# Patient Record
Sex: Female | Born: 1985 | Marital: Married | Smoking: Former smoker
Health system: Southern US, Community
[De-identification: ages and names within clinical notes are randomized; demographics above are authoritative.]

## PROBLEM LIST (undated history)

## (undated) DIAGNOSIS — F1910 Other psychoactive substance abuse, uncomplicated: Secondary | ICD-10-CM

## (undated) DIAGNOSIS — M419 Scoliosis, unspecified: Secondary | ICD-10-CM

## (undated) DIAGNOSIS — R569 Unspecified convulsions: Secondary | ICD-10-CM

## (undated) DIAGNOSIS — J45909 Unspecified asthma, uncomplicated: Secondary | ICD-10-CM

## (undated) DIAGNOSIS — I10 Essential (primary) hypertension: Secondary | ICD-10-CM

## (undated) DIAGNOSIS — F449 Dissociative and conversion disorder, unspecified: Secondary | ICD-10-CM

## (undated) HISTORY — PX: TUBAL LIGATION: SHX77

## (undated) HISTORY — PX: BACK SURGERY: SHX140

---

## 2011-04-03 ENCOUNTER — Emergency Department: Payer: Self-pay

## 2011-09-26 ENCOUNTER — Observation Stay: Payer: Self-pay

## 2013-03-23 ENCOUNTER — Emergency Department: Payer: Self-pay | Admitting: Emergency Medicine

## 2013-03-23 LAB — COMPREHENSIVE METABOLIC PANEL
Albumin: 3.8 g/dL (ref 3.4–5.0)
Alkaline Phosphatase: 44 U/L — ABNORMAL LOW (ref 50–136)
Bilirubin,Total: 0.3 mg/dL (ref 0.2–1.0)
Chloride: 110 mmol/L — ABNORMAL HIGH (ref 98–107)
EGFR (African American): >60
Glucose: 82 mg/dL (ref 65–99)
Osmolality: 281 (ref 275–301)
Potassium: 3.2 mmol/L — ABNORMAL LOW (ref 3.5–5.1)
Sodium: 142 mmol/L (ref 136–145)
Total Protein: 7.2 g/dL (ref 6.4–8.2)

## 2013-03-23 LAB — URINALYSIS, COMPLETE
Bilirubin,UR: NEGATIVE
Protein: 30
RBC,UR: 3 /HPF (ref 0–5)
Specific Gravity: 1.029 (ref 1.003–1.030)
WBC UR: 5 /HPF (ref 0–5)

## 2013-03-23 LAB — CBC
HCT: 31.4 % — ABNORMAL LOW (ref 35.0–47.0)
MCH: 31.6 pg (ref 26.0–34.0)
MCV: 94 fL (ref 80–100)
Platelet: 323 10*3/uL (ref 150–440)
RDW: 13.1 % (ref 11.5–14.5)
WBC: 3.1 10*3/uL — ABNORMAL LOW (ref 3.6–11.0)

## 2013-03-23 LAB — PREGNANCY, URINE: Pregnancy Test, Urine: NEGATIVE m[IU]/mL

## 2013-09-28 ENCOUNTER — Emergency Department: Payer: Self-pay | Admitting: Emergency Medicine

## 2013-10-31 ENCOUNTER — Emergency Department: Payer: Self-pay | Admitting: Emergency Medicine

## 2014-04-21 ENCOUNTER — Emergency Department: Payer: Self-pay | Admitting: Emergency Medicine

## 2014-04-21 LAB — BASIC METABOLIC PANEL
Anion Gap: 8 (ref 7–16)
BUN: 13 mg/dL (ref 7–18)
CHLORIDE: 107 mmol/L (ref 98–107)
CO2: 23 mmol/L (ref 21–32)
Calcium, Total: 9.5 mg/dL (ref 8.5–10.1)
Creatinine: 0.91 mg/dL (ref 0.60–1.30)
EGFR (African American): >60
EGFR (Non-African Amer.): >60
Glucose: 90 mg/dL (ref 65–99)
Osmolality: 275 (ref 275–301)
POTASSIUM: 3.9 mmol/L (ref 3.5–5.1)
SODIUM: 138 mmol/L (ref 136–145)

## 2014-04-21 LAB — CBC
HCT: 30.4 % — AB (ref 35.0–47.0)
HGB: 9.7 g/dL — AB (ref 12.0–16.0)
MCH: 27.9 pg (ref 26.0–34.0)
MCHC: 31.8 g/dL — ABNORMAL LOW (ref 32.0–36.0)
MCV: 88 fL (ref 80–100)
Platelet: 346 10*3/uL (ref 150–440)
RBC: 3.46 10*6/uL — ABNORMAL LOW (ref 3.80–5.20)
RDW: 19.3 % — ABNORMAL HIGH (ref 11.5–14.5)
WBC: 3.9 10*3/uL (ref 3.6–11.0)

## 2014-04-21 LAB — TROPONIN I

## 2014-04-21 LAB — LIPASE, BLOOD: Lipase: 106 U/L (ref 73–393)

## 2014-05-10 ENCOUNTER — Inpatient Hospital Stay: Payer: Self-pay | Admitting: Internal Medicine

## 2014-05-10 LAB — COMPREHENSIVE METABOLIC PANEL
ALBUMIN: 4.1 g/dL (ref 3.4–5.0)
ANION GAP: 8 (ref 7–16)
Alkaline Phosphatase: 47 U/L
BILIRUBIN TOTAL: 0.5 mg/dL (ref 0.2–1.0)
BUN: 9 mg/dL (ref 7–18)
CALCIUM: 8 mg/dL — AB (ref 8.5–10.1)
CHLORIDE: 102 mmol/L (ref 98–107)
CO2: 25 mmol/L (ref 21–32)
Creatinine: 0.79 mg/dL (ref 0.60–1.30)
EGFR (Non-African Amer.): >60
GLUCOSE: 116 mg/dL — AB (ref 65–99)
OSMOLALITY: 270 (ref 275–301)
POTASSIUM: 3.3 mmol/L — AB (ref 3.5–5.1)
SGOT(AST): 21 U/L (ref 15–37)
SGPT (ALT): 19 U/L
Sodium: 135 mmol/L — ABNORMAL LOW (ref 136–145)
TOTAL PROTEIN: 8 g/dL (ref 6.4–8.2)

## 2014-05-10 LAB — CBC
HCT: 33.3 % — AB (ref 35.0–47.0)
HGB: 10.3 g/dL — ABNORMAL LOW (ref 12.0–16.0)
MCH: 27.7 pg (ref 26.0–34.0)
MCHC: 31.1 g/dL — AB (ref 32.0–36.0)
MCV: 89 fL (ref 80–100)
Platelet: 325 10*3/uL (ref 150–440)
RBC: 3.73 10*6/uL — ABNORMAL LOW (ref 3.80–5.20)
RDW: 18.7 % — ABNORMAL HIGH (ref 11.5–14.5)
WBC: 8.3 10*3/uL (ref 3.6–11.0)

## 2014-05-10 LAB — URINALYSIS, COMPLETE
BACTERIA: NONE SEEN
BILIRUBIN, UR: NEGATIVE
BLOOD: NEGATIVE
Glucose,UR: 50 mg/dL (ref 0–75)
Ketone: NEGATIVE
Leukocyte Esterase: NEGATIVE
Nitrite: NEGATIVE
Ph: 6 (ref 4.5–8.0)
Protein: NEGATIVE
RBC,UR: <1 /HPF (ref 0–5)
SPECIFIC GRAVITY: 1.009 (ref 1.003–1.030)
Squamous Epithelial: 3
WBC UR: 2 /HPF (ref 0–5)

## 2014-05-10 LAB — TROPONIN I: Troponin-I: <0.02 ng/mL

## 2014-05-11 LAB — BASIC METABOLIC PANEL
ANION GAP: 4 — AB (ref 7–16)
BUN: 7 mg/dL (ref 7–18)
CALCIUM: 8.4 mg/dL — AB (ref 8.5–10.1)
CO2: 26 mmol/L (ref 21–32)
CREATININE: 0.75 mg/dL (ref 0.60–1.30)
Chloride: 105 mmol/L (ref 98–107)
EGFR (Non-African Amer.): >60
Glucose: 122 mg/dL — ABNORMAL HIGH (ref 65–99)
OSMOLALITY: 269 (ref 275–301)
POTASSIUM: 4.6 mmol/L (ref 3.5–5.1)
Sodium: 135 mmol/L — ABNORMAL LOW (ref 136–145)

## 2014-05-11 LAB — CBC WITH DIFFERENTIAL/PLATELET
BASOS ABS: 0 10*3/uL (ref 0.0–0.1)
Basophil %: 0.2 %
EOS ABS: 0 10*3/uL (ref 0.0–0.7)
Eosinophil %: 0 %
HCT: 31.3 % — ABNORMAL LOW (ref 35.0–47.0)
HGB: 9.6 g/dL — AB (ref 12.0–16.0)
LYMPHS ABS: 0.3 10*3/uL — AB (ref 1.0–3.6)
LYMPHS PCT: 2.4 %
MCH: 27.2 pg (ref 26.0–34.0)
MCHC: 30.6 g/dL — ABNORMAL LOW (ref 32.0–36.0)
MCV: 89 fL (ref 80–100)
MONOS PCT: 1.6 %
Monocyte #: 0.2 x10 3/mm (ref 0.2–0.9)
NEUTROS ABS: 10.9 10*3/uL — AB (ref 1.4–6.5)
NEUTROS PCT: 95.8 %
Platelet: 323 10*3/uL (ref 150–440)
RBC: 3.53 10*6/uL — AB (ref 3.80–5.20)
RDW: 18.4 % — ABNORMAL HIGH (ref 11.5–14.5)
WBC: 11.3 10*3/uL — ABNORMAL HIGH (ref 3.6–11.0)

## 2014-10-14 ENCOUNTER — Emergency Department: Payer: Self-pay | Admitting: Emergency Medicine

## 2014-10-14 LAB — BASIC METABOLIC PANEL
Anion Gap: 6 — ABNORMAL LOW (ref 7–16)
BUN: 10 mg/dL (ref 7–18)
CALCIUM: 8.7 mg/dL (ref 8.5–10.1)
CO2: 24 mmol/L (ref 21–32)
Chloride: 108 mmol/L — ABNORMAL HIGH (ref 98–107)
Creatinine: 0.89 mg/dL (ref 0.60–1.30)
EGFR (Non-African Amer.): >60
Glucose: 84 mg/dL (ref 65–99)
Osmolality: 274 (ref 275–301)
Potassium: 4 mmol/L (ref 3.5–5.1)
Sodium: 138 mmol/L (ref 136–145)

## 2014-10-14 LAB — CBC
HCT: 36.9 % (ref 35.0–47.0)
HGB: 11.4 g/dL — ABNORMAL LOW (ref 12.0–16.0)
MCH: 26.6 pg (ref 26.0–34.0)
MCHC: 30.8 g/dL — ABNORMAL LOW (ref 32.0–36.0)
MCV: 86 fL (ref 80–100)
Platelet: 232 10*3/uL (ref 150–440)
RBC: 4.28 10*6/uL (ref 3.80–5.20)
RDW: 19.3 % — AB (ref 11.5–14.5)
WBC: 5.7 10*3/uL (ref 3.6–11.0)

## 2014-10-14 LAB — TROPONIN I: Troponin-I: <0.02 ng/mL

## 2014-10-20 ENCOUNTER — Emergency Department: Payer: Self-pay | Admitting: Emergency Medicine

## 2014-10-20 LAB — COMPREHENSIVE METABOLIC PANEL
ALBUMIN: 4.6 g/dL (ref 3.4–5.0)
ALK PHOS: 56 U/L
Anion Gap: 11 (ref 7–16)
BUN: 10 mg/dL (ref 7–18)
Bilirubin,Total: 0.4 mg/dL (ref 0.2–1.0)
CALCIUM: 9.3 mg/dL (ref 8.5–10.1)
CREATININE: 0.94 mg/dL (ref 0.60–1.30)
Chloride: 103 mmol/L (ref 98–107)
Co2: 20 mmol/L — ABNORMAL LOW (ref 21–32)
EGFR (African American): >60
EGFR (Non-African Amer.): >60
GLUCOSE: 131 mg/dL — AB (ref 65–99)
Osmolality: 269 (ref 275–301)
Potassium: 3.8 mmol/L (ref 3.5–5.1)
SGOT(AST): 16 U/L (ref 15–37)
SGPT (ALT): 19 U/L
Sodium: 134 mmol/L — ABNORMAL LOW (ref 136–145)
Total Protein: 8.7 g/dL — ABNORMAL HIGH (ref 6.4–8.2)

## 2014-10-20 LAB — URINALYSIS, COMPLETE
BILIRUBIN, UR: NEGATIVE
Blood: NEGATIVE
Glucose,UR: NEGATIVE mg/dL (ref 0–75)
Nitrite: NEGATIVE
PH: 5 (ref 4.5–8.0)
Protein: 30
Specific Gravity: 1.02 (ref 1.003–1.030)

## 2014-10-20 LAB — CBC
HCT: 37.2 % (ref 35.0–47.0)
HGB: 11.2 g/dL — AB (ref 12.0–16.0)
MCH: 26.3 pg (ref 26.0–34.0)
MCHC: 30.1 g/dL — ABNORMAL LOW (ref 32.0–36.0)
MCV: 87 fL (ref 80–100)
Platelet: 487 10*3/uL — ABNORMAL HIGH (ref 150–440)
RBC: 4.25 10*6/uL (ref 3.80–5.20)
RDW: 20.1 % — ABNORMAL HIGH (ref 11.5–14.5)
WBC: 10.5 10*3/uL (ref 3.6–11.0)

## 2014-10-20 LAB — DRUG SCREEN, URINE
Amphetamines, Ur Screen: NEGATIVE (ref ?–1000)
BENZODIAZEPINE, UR SCRN: POSITIVE (ref ?–200)
Barbiturates, Ur Screen: NEGATIVE (ref ?–200)
Cannabinoid 50 Ng, Ur ~~LOC~~: NEGATIVE (ref ?–50)
Cocaine Metabolite,Ur ~~LOC~~: NEGATIVE (ref ?–300)
MDMA (ECSTASY) UR SCREEN: NEGATIVE (ref ?–500)
METHADONE, UR SCREEN: NEGATIVE (ref ?–300)
Opiate, Ur Screen: NEGATIVE (ref ?–300)
PHENCYCLIDINE (PCP) UR S: NEGATIVE (ref ?–25)
Tricyclic, Ur Screen: NEGATIVE (ref ?–1000)

## 2014-10-20 LAB — PREGNANCY, URINE: Pregnancy Test, Urine: NEGATIVE m[IU]/mL

## 2014-10-20 LAB — ETHANOL: Ethanol: <3 mg/dL

## 2014-10-20 LAB — TROPONIN I: Troponin-I: <0.02 ng/mL

## 2014-10-20 LAB — D-DIMER(ARMC): D-DIMER: 277 ng/mL

## 2014-10-20 LAB — CK: CK, Total: 126 U/L (ref 26–192)

## 2014-11-27 ENCOUNTER — Inpatient Hospital Stay: Payer: Self-pay | Admitting: Internal Medicine

## 2015-02-04 NOTE — Discharge Summary (Signed)
PATIENT NAME:  Crystal Villegas, Crystal Villegas MR#:  [id number] DATE OF BIRTH:  1986-06-15  DATE OF ADMISSION:  05/10/2014 DATE OF DISCHARGE:  05/12/2014  DISCHARGE DIAGNOSIS:  Acute hypoxic respiratory failure, likely secondary to acute asthma exacerbation.   SECONDARY DIAGNOSIS: Asthma.   CONSULTATIONS: None.   PROCEDURES AND RADIOLOGY: Chest x-ray on the July 28, showed no active cardiopulmonary disease.   MAJOR LABORATORY PANEL: Urinalysis on admission was negative.   HISTORY AND SHORT HOSPITAL COURSE: The patient is a 29 year old female with no significant medical problems who was admitted for acute hypoxic respiratory failure thought to be due to an asthma exacerbation. Please see Dr. Thomasena Edisadhika Kalisetti's dictated history and physical for further details. The patient was sick enough where she required CCU stepdown unit for close monitoring and BiPAP as needed. She was also having labile heart rate with fluctuation anywhere from 90s-130s, 140s and she got admitted, likely due to poor limb result. As the asthma started getting under better control, her heart rate also started improving. She was feeling much better, was placed on nasal cannula and was tolerating it fine, was close to her baseline when was discharged home on July 30, in stable condition.   PHYSICAL EXAMINATION: VITAL SIGNS: On the date of discharge are as follows: Temperature 98, heart rate 86 per minute, respirations 16 per minute, blood pressure 111/88 mmHg and she was saturating 97% on room air, although she desaturated to 84% on ambulation, and was provided 2 liters oxygen via nasal cannula.  CARDIOVASCULAR: S1, S2 normal. No murmurs, rubs, or gallop.  LUNGS: Clear to auscultation bilaterally. No wheezing, rales, rhonchi, or crepitation.  ABDOMEN: Soft, benign.  NEUROLOGIC: Nonfocal examination.   All other physical examination remained at baseline.   DISCHARGE MEDICATIONS: 1.   ProAir HFA 2 puffs inhaled, daily as needed.  2.  QVAR  2  puffs inhaled twice a day. 3.  Albuterol nebulizer every 6  hours as needed.  4.  Cardizem 30 mg p.o. b.i.d.  4.  Prednisone 60 mg p.o. daily, taper 5 mg daily until finished.   DISCHARGE DIET: Regular.   DISCHARGE ACTIVITY: As tolerated.   DISCHARGE INSTRUCTIONS AND FOLLOWUP:  The patient was instructed to follow up with Dr. Ned ClinesHerbon Fleming from pulmonary in 1-2 weeks. She was instructed to find a new primary care physician, likely Duncan Dulleresa Tullo or Ronna PolioJennifer Walker at [facility] in [place]. She actually got a new primary care physician, Dr. Verlon SettingSikes and she did get an appointment for August 18 at 10:00 a.m.  TOTAL TIME DISCHARGING THIS PATIENT: 45 minutes.   TIME DISCHARGING THIS PATIENT: 45 minutes.     ____________________________ Ellamae SiaVipul S. Sherryll BurgerShah, MD vss:ds D: 05/12/2014 21:56:03 ET T: 05/12/2014 22:09:32 ET JOB#: [id number]  cc: Elfida Shimada S. Sherryll BurgerShah, MD, <Dictator> Herbon E. Meredeth IdeFleming, MD Kris MoutonLarry J. Celine MansSykes, PA-C Ellamae SiaVIPUL S [facility] MD ELECTRONICALLY SIGNED 05/17/2014 9:29

## 2015-02-04 NOTE — H&P (Signed)
PATIENT NAME:  Crystal Villegas, Crystal Villegas MR#:  [id number] DATE OF BIRTH:  Apr 23, 1986  DATE OF ADMISSION:  05/10/2014  ADMITTING PHYSICIAN: Enid Baas, M.D.   PRIMARY CARE PHYSICIAN: Kris Mouton. Celine Mans, in [place].  CHIEF COMPLAINT: Difficulty breathing.   HISTORY OF PRESENT ILLNESS: Ms. Crystal Villegas is a 29 year old, African American female with past medical history significant for asthma, who presents from her PCP's office secondary to worsening breathing and asthma exacerbation. The patient states her breathing has gotten worse over the last couple of days. She has been wheezing pretty bad. She was using her inhalers and nebulizers at home, though with no improvement, so went to see her PCP today. She had significant wheezing, was hypoxic, saturations in the 80 degrees, so was placed on 5 liters nasal cannula and sent over to the ER. The patient has significant wheezing for which she was placed on continuous nebulizers. She received a dose of Solu-Medrol at the PCP's office already, and is being admitted for asthma exacerbation. The patient denies any recent travel or exposure to sick contacts or pets at this time. The last time she had exacerbation was several months ago and has not been hospitalized since she was young. Also, there is a history of intubation for asthma exacerbation in the remote past.   PAST MEDICAL HISTORY: Asthma.   PAST SURGICAL HISTORY:  1. C-sections, 3. 2. Back surgery for scoliosis.  ALLERGIES: No known drug allergies.   CURRENT HOME MEDICATIONS: 1. Qvar 40 mcg inhaler 2 puffs twice a day. 2. Albuterol inhaler 2 puffs q.6 h. p.r.n. for wheezing.  3. Albuterol nebulizer up to 4 times a day as needed for wheezing.   SOCIAL HISTORY: Lives at home with her husband. No smoking, alcohol or drug abuse. Works at the [employer].   FAMILY HISTORY: Both parents with heart disease.   REVIEW OF SYSTEMS:  CONSTITUTIONAL: No fever, fatigue or weakness.  EYES: No blurry vision,  double vision, inflammation or glaucoma.  ENT: No tinnitus, ear pain, hearing loss, epistaxis or discharge.  RESPIRATORY: Positive for wheezing and asthma. No COPD, hemoptysis or cough.  CARDIOVASCULAR: Positive for chest tightness from the difficulty breathing. No orthopnea, edema, arrhythmia, palpitations, or syncope.  GASTROINTESTINAL: No nausea, vomiting, diarrhea, abdominal pain, hematemesis, or melena.  GENITOURINARY: No dysuria, hematuria, renal calculi, frequency, or incontinence.  ENDOCRINE: No polyuria, nocturia, thyroid problems,  heat or cold intolerance.  HEMATOLOGY: No anemia, easy bruising or bleeding.  SKIN: No acne, rash or lesions. MUSCULOSKELETAL: No neck, back, or shoulder pain, arthritis or gout.  NEUROLOGIC: No numbness, weakness, CVA, TIA, or seizures.  PSYCHOLOGICAL: Denied anxiety, insomnia, and depression.   PHYSICAL EXAMINATION:  VITAL SIGNS: Temperature 98.8 degrees Fahrenheit, pulse 100, respirations 36, blood pressure 118/90, pulse oximetry 97% on 2 liters oxygen.  GENERAL: Well-built, well-nourished female sitting in bed, not in any acute distress.  HEENT: Normocephalic, atraumatic. Pupils equal, round, reacting to light. Anicteric sclerae. Extraocular movements intact. Oropharynx clear without erythema, mass or exudates.  NECK: Supple. No thyromegaly, JVD or carotid bruits. No lymphadenopathy.  LUNGS: She has significant expiratory wheezing diffusely throughout both lungs, use of accessory muscles noted on minimal exacerbation. No crackles. No rhonchi.  HEART: S1 and S2, rapid rate and regular rhythm. No murmurs, rubs, or gallops.  ABDOMEN: Soft, nontender, nondistended. No hepatosplenomegaly. Normal bowel sounds.  EXTREMITIES: No pedal edema. No clubbing or cyanosis; 2+ dorsalis pedis pulses palpable bilaterally.  SKIN: No acne, rash or lesions.  LYMPHATICS: No cervical or inguinal  lymphadenopathy.  NEUROLOGIC: Cranial nerves intact. No focal motor or sensory  deficits.  PSYCHOLOGICAL: The patient is awake, alert, oriented x 3.   LABORATORY DATA: WBC 8.3, hemoglobin 10.3, hematocrit 33.3, platelet count 325,000. Sodium 137, potassium 3.3, chloride 102, bicarbonate 25, BUN 9, creatinine 0.79, glucose 116, calcium of 8.0. ALT 19, AST 21, alkaline phosphatase 47, total bilirubin 0.5, albumin 4.1.   Chest x-ray showing clear lung fields. No acute cardiopulmonary disease.   Troponin negative.   EKG showing sinus tachycardia. No acute ST-T wave abnormalities.   ASSESSMENT AND PLAN: A 29 year old female with history of asthma sent in from PCP's office for acute asthma exacerbation and also hypoxia.   1. Hypoxic respiratory failure secondary to acute asthma exacerbation. Saturations were 80% on room air on arrival, now on oxygen support. Continue nebulizer treatments. Xopenex nebulizers ordered as she was tachycardic, and also continue IV Solu-Medrol.  2. Hypokalemia, is being replaced.  3. Deep venous thrombosis prophylaxis, on subcutaneous heparin.   CODE STATUS: Full Code.   TIME SPENT ON ADMISSION: 50 minutes.    ____________________________ Enid Baasadhika Cherrill Scrima, MD rk:jr D: 05/10/2014 13:38:38 ET T: 05/10/2014 14:38:33 ET JOB#: [id number]  cc: Enid Baasadhika Jourdon Zimmerle, MD, <Dictator> Kris MoutonLarry J. Gerilyn PilgrimSykes, PA-C Enid BaasADHIKA Lakhia Gengler MD ELECTRONICALLY SIGNED 05/11/2014 15:11

## 2015-02-12 NOTE — Discharge Summary (Signed)
PATIENT NAME:  Crystal Villegas, Crystal Villegas MR#:  [id number] DATE OF BIRTH:  1986-01-19  DATE OF ADMISSION:  11/27/2014 DATE OF DISCHARGE:  11/28/2014  ADMITTING DIAGNOSIS:  Shortness of breath.   DISCHARGE DIAGNOSES:   1.  Asthma exacerbation.  2.  History of seizure disorder.   CONSULTATIONS: None.   PROCEDURES: Chest x-ray, February 14, shows no abnormal cardiopulmonary findings.   HISTORY OF PRESENT ILLNESS: This 29 year old female with history of bronchial asthma and hypertension, presents to the Emergency Room with a complaint of ongoing and progressive shortness of breath with wheezing and cough for 2 days. She has been using nebulizers and inhalers at home but has continued to progress. On presentation to the Emergency Room oxygen saturation was 89% on room air. She received Solu-Medrol, magnesium and DuoNebs in the Emergency Room with minimal improvement. She is being admitted for further evaluation and treatment.   HOSPITAL COURSE AND TREATMENT:  1.  Asthma exacerbation. The patient received initial dose of 125 mg of methylprednisolone followed by 40 mg every 6 hours. She received 2 grams of magnesium in the Emergency Room. She was started on levofloxacin 500 mg daily. She did very well overnight and on the morning of discharge she was comfortable on room air with oxygen saturations 96% at rest and 94% with exertion. She does continue to have minimal wheezing. She reports multiple asthma exacerbations in the past and states that at this point, she feels comfortable managing this exacerbation at home. She is being discharged with nebulizers every 6 hours as needed for shortness of breath. Albuterol inhaler to be used as needed along with a prolonged prednisone taper and a prescription for Levaquin to complete a 7 day course. She will need to follow up with primary care in 1 to 2 weeks and also with pulmonology. She states she has had multiple hospitalizations for asthma in the past and it would be  appropriate for her to have more in depth evaluation by a pulmonologist. She was given contact information for [facility] and instructed to call for appointment as these offices were closed on the day of discharge.  2.  Hypertension: The patient's blood pressures were very well controlled in hospital, and she is being discharged on her former regimen of diltiazem 30 mg 1 tablet twice a day.  3.  History of seizure disorder: She continues on Keppra. She had no seizure-like activity during her hospitalization.    DISCHARGE PHYSICAL EXAMINATION:  VITAL SIGNS: Temperature 98.3, pulse 110, respirations 18, blood pressure 115/75, oxygenation 96% on room air at rest and 94 with exertion.  GENERAL: No acute distress.  RESPIRATORY: Scattered wheezes, good air movement. No respiratory distress.  CARDIOVASCULAR: Regular rate and rhythm. No murmurs, rubs or gallops. No peripheral edema. Peripheral pulses 2+.  ABDOMEN: Soft, nontender, nondistended. Bowel sounds are normal. No guarding, rebound, no hepatosplenomegaly.  EXTREMITIES: No joint effusions, strength 5 out of 5 throughout. Peripheral pulses 2+.  NEUROLOGIC: Cranial nerves II through XII are grossly intact. Strength and tone are normal and equal. Sensation is intact, nonfocal examination.  PSYCHIATRIC: The patient alert and oriented x4 with good insight into her clinical condition. No signs of uncontrolled depression and anxiety.   DISCHARGE LABORATORY:  Sodium 137, potassium 3.7, chloride 104, bicarbonate 26, BUN 8, creatinine 0.89, glucose 95 and LFTs normal. White blood cells 8, hemoglobin 11.7, hematocrit 37.5, platelets 331,000, MCV 89.  Sputum culture appears to be normal flora.   CONDITION ON DISCHARGE: Stable.   DISPOSITION: Discharge to  home with no further home health needs.   DISCHARGE MEDICATIONS:  1.  Albuterol 2.5 mg/3 mL one vial inhaled every 6 hours as needed for shortness of breath.  2.  Ventolin CFC free 90  mcg/inhalations 2 puffs inhaled 4 times a day as needed for shortness of breath.  3.  Diltiazem 30 mg 1 tablet twice a day.  4.  Levetiracetam 500 mg 1 tablet twice a day.  5.  Aleve 200 mg 1 tablet every 8 hours as needed for pain.  6.  Meclizine 25 mg 1 tablet once a day as needed for dizziness.  7.  Ferrous sulfate 325 mg 1 tablet twice a day.  8.  Prednisone 50 mg taper x 10 mg every 2 days and then stop.  9.  Levaquin 500 mg 1 tablet once a day for 7 days.   DISCHARGE INSTRUCTIONS:   DIET: No restrictions.   ACTIVITY: No restrictions.   FOLLOW-UP:  Follow up with primary care and also with [facility] in the next 1 to 2 weeks.   TIME SPENT ON DISCHARGE: 35 minutes.    ____________________________ Ena Dawleyatherine P. Clent RidgesWalsh, MD cpw:at D: 12/01/2014 17:44:20 ET T: 12/01/2014 18:33:31 ET JOB#: [id number]  cc: Santina Evansatherine P. Clent RidgesWalsh, MD, <Dictator> Gale JourneyATHERINE P Sumire Halbleib MD ELECTRONICALLY SIGNED 12/01/2014 23:30

## 2015-02-12 NOTE — H&P (Signed)
PATIENT NAME:  Crystal Villegas, Crystal Villegas MR#:  [id number] DATE OF BIRTH:  1986/03/22  DATE OF ADMISSION:  11/27/2014  REFERRING PHYSICIAN: Sheryl L. Mindi Junker, MD  PRIMARY CARE PRACTITIONER: Kris Mouton. Gerilyn Pilgrim, PA-C in [place].  ADMITTING DOCTOR: Crissie Figures, MD   CHIEF COMPLAINT: 1.  Shortness of breath with wheezing.  2.  Cough.   HISTORY OF PRESENT ILLNESS: A 29 year old female with a history of bronchial asthma, hypertension, presents to the Emergency Room with the complaints of ongoing shortness of breath with wheezing, wheezing associated with cough for the past 2 days. The patient has been using her nebulizers and inhalers at home but continued to have this cough with shortness of breath and wheezing, hence came to the Emergency Room for further evaluation. In the Emergency Room, on arrival, the patient was found to be in acute respiratory distress and room air oxygen saturations were 89%, was evaluated by the ED physician, and was found to have acute exacerbation of bronchial asthma, was given IV Solu-Medrol, IV magnesium, and vigorous DuoNebs along with oxygen supplementation, following which her oxygen saturations improved but continued to have some persistent wheezing. Hence, medical team hospitalist service was consulted for further evaluation and management. She does have some chest discomfort secondary due to coughing, but denies any chest pain. No dizziness or syncopal episodes. No fever. She does have some cough with minimal sputum. No nausea. No vomiting, diarrhea. No urinary symptoms. Following her treatment in the Emergency Room with IV Solu-Medrol, magnesium, and vigorous DuoNebs and oxygen supplementation, she states that she is feeling slightly better and comfortably resting.  PAST MEDICAL HISTORY:  1.  Bronchial asthma.  2.  Hypertension. 3. Seizure disorder.  PAST SURGICAL HISTORY:  1.  Back surgery.  2.  Status post C-sections x 3.   ALLERGIES: No known drug allergies.   FAMILY  HISTORY: Both parents with heart disease.   SOCIAL HISTORY: She is married, lives with her husband. She works at [employer] and no history of any smoking, alcohol, or substance abuse.  HOME MEDICATIONS:  1.  QVAR 1 puff twice a day. 2.  Albuterol inhaler 2 puffs every 4 to 6 hours as needed.  3. Keppra 500 mg bid. 4.Cardizem 30 mg bid.  REVIEW OF SYSTEMS:  CONSTITUTIONAL: Negative for fever or chills. She does have some fatigue.  EYES: Negative for blurred vision, double vision. No pain noted. No discharge.  EARS, NOSE, AND THROAT: Negative for tinnitus, ear pain, hearing loss, epistaxis, nasal discharge, or difficulty swallowing.  RESPIRATORY: Positive for cough with wheezing and dyspnea as noted in the history of present illness. No hemoptysis. She does have some chest soreness secondary due to coughing.  CARDIOVASCULAR: Negative for chest pain, palpitations, dizziness, syncope, orthopnea, PND, dyspnea on exertion, or pedal edema.  GASTROINTESTINAL: Negative for nausea, vomiting, diarrhea, constipation, abdominal pain, hematemesis, melena, rectal bleeding, GERD symptoms.  GENITOURINARY: Negative for dysuria, frequency, urgency, or hematuria.  ENDOCRINE: Negative for polyuria, nocturia, heat or cold intolerance.  HEMATOLOGIC AND LYMPHATIC: Negative for anemia, easy bruising, bleeding, or swollen glands.  INTEGUMENTARY: Negative for acne, skin rash, or lesions.  MUSCULOSKELETAL: Negative for back or neck pain. No history of arthritis or gout.  NEUROLOGICAL: Negative for focal weakness, numbness. No history of CVA, TIA.  PSYCHIATRIC: Negative for anxiety, insomnia, or depression.   PHYSICAL EXAMINATION:  VITAL SIGNS: On arrival, temperature 98.4 degrees Fahrenheit, pulse rate 116 per minute, respirations 20 per minute, blood pressure 115/86, oxygen saturation 89% on room air  on arrival, currently is 98% on oxygen supplementation.  GENERAL: Well-developed, well-nourished, alert, in no  acute distress, comfortably resting in the bed.  HEAD: Atraumatic, normocephalic.  EYES: Pupils equal, react to light and accommodation. No conjunctival pallor. No icterus. Extraocular movements intact.  NOSE: No drainage. No lesions.  EARS: No drainage, no external lesions. ORAL CAVITY: No mucosal lesions. NECK: Supple. No JVD. No thyromegaly. No carotid bruit. Range of motion of neck within normal limits.  RESPIRATORY: Bilateral air entry present. Not using accessory muscles of respiration. Bilateral rhonchi present. No rales. CARDIOVASCULAR: S1, S2 regular. No murmurs, gallops, or clicks or rubs. Peripheral pulses equal at carotid, femoral, and pedal pulses. No peripheral edema.  GASTROINTESTINAL: Abdomen soft, nontender. No hepatosplenomegaly. No masses noted. No rigidity, no guarding. Bowel sounds present and equal in all 4 quadrants.  GENITOURINARY: Deferred.  MUSCULOSKELETAL: No joint tenderness or effusion. Range of motion adequate. Strength and tone equal bilaterally.  SKIN: Inspection within normal limits. No obvious wounds.  LYMPHATIC: No cervical lymphadenopathy.  VASCULAR: Good dorsalis pedis and posterior tibial pulses. NEUROLOGICAL: Alert, awake, and oriented x 3. Cranial nerves II through XII grossly intact. No sensory defect. Motor strength 5/5 in both upper and lower extremities. DTRs 2+ and bilaterally symmetrical.  PSYCHIATRIC: Alert, awake, and oriented x 3. Judgment and insight adequate. Memory and mood within normal limits.   ANCILLARY DATA:   LABORATORY DATA: Serum glucose 95, BUN 8, creatinine 0.89, sodium 137, potassium 3.7, chloride 104, bicarbonate 26, total calcium 8.8, total protein 8.2, albumin 4.0, total bilirubin 0.4, alkaline phosphatase 71, AST 26, ALT 15. WBC 8.0, hemoglobin 11.7, hematocrit 37.8, platelet count 331,000.   IMAGING STUDIES:  CHEST X-RAY: No abnormal cardiopulmonary finding.   ASSESSMENT AND PLAN: A 29 year old female with a history of  bronchial asthma, hypertension, who presents with the complaints of shortness of breath with wheezing associated with cough ongoing for the past 2 days which was not relieved by her nebulizers at home, hence, came to the Emergency Room for further evaluation. On evaluation, noted to have acute respiratory distress with hypoxia secondary due to acute exacerbation of bronchial asthma. 1.  Acute hypoxic respiratory failure secondary bronchial asthma exacerbation. 2.  Bronchial asthma exacerbation.   PLAN: Admit to medical floor. Continue oxygen supplementation, IV Solu-Medrol, sputum culture and sensitivity. Continue vigorous DuoNebs, IV Levaquin. Follow up oxygen saturations.  3.  Hypertension: Controlled on home medications. Continue same.  4.  Deep vein thrombosis prophylaxis: Subcutaneous Lovenox.  5.  Gastrointestinal prophylaxis: Proton pump inhibitor.   CODE STATUS: Full code.   TIME SPENT: 50 minutes.   ____________________________ Crissie FiguresEdavally N. Avrohom Mckelvin, MD enr:ST D: 11/27/2014 21:42:21 ET T: 11/27/2014 22:19:41 ET JOB#: [id number]  cc: Crissie FiguresEdavally N. Reather Steller, MD, <Dictator> Kris MoutonLarry J. Gerilyn PilgrimSykes, PA-C Debbe MountsEDAVALLY N Zakyria Metzinger MD ELECTRONICALLY SIGNED 11/28/2014 12:48

## 2015-07-05 ENCOUNTER — Emergency Department
Admission: EM | Admit: 2015-07-05 | Discharge: 2015-07-05 | Disposition: A | Discharge status: Home / Self Care | Payer: Medicaid Other | Attending: Emergency Medicine | Admitting: Emergency Medicine

## 2015-07-05 ENCOUNTER — Emergency Department: Payer: Medicaid Other

## 2015-07-05 ENCOUNTER — Encounter: Payer: Self-pay | Admitting: Emergency Medicine

## 2015-07-05 DIAGNOSIS — J440 Chronic obstructive pulmonary disease with acute lower respiratory infection: Secondary | ICD-10-CM | POA: Diagnosis not present

## 2015-07-05 DIAGNOSIS — R0602 Shortness of breath: Secondary | ICD-10-CM | POA: Diagnosis present

## 2015-07-05 DIAGNOSIS — F410 Panic disorder [episodic paroxysmal anxiety] without agoraphobia: Secondary | ICD-10-CM

## 2015-07-05 HISTORY — DX: Unspecified convulsions: R56.9

## 2015-07-05 HISTORY — DX: Unspecified asthma, uncomplicated: J45.909

## 2015-07-05 HISTORY — DX: Essential (primary) hypertension: I10

## 2015-07-05 HISTORY — DX: Scoliosis, unspecified: M41.9

## 2015-07-05 MED ORDER — DIAZEPAM 5 MG PO TABS
10.0000 mg | ORAL_TABLET | Freq: Once | ORAL | Status: AC
  Administered 2015-07-05: 10 mg via ORAL
  Filled 2015-07-05: qty 2

## 2015-07-05 MED ORDER — IBUPROFEN 800 MG PO TABS
800.0000 mg | ORAL_TABLET | Freq: Once | ORAL | Status: AC
  Administered 2015-07-05: 800 mg via ORAL
  Filled 2015-07-05: qty 1

## 2015-07-05 MED ORDER — DIAZEPAM 5 MG PO TABS: 5.0000 mg | ORAL_TABLET | Freq: Three times a day (TID) | ORAL | Status: DC | PRN

## 2015-07-05 NOTE — Discharge Instructions (Signed)

## 2015-07-05 NOTE — ED Notes (Signed)
Pt to ED via EMS with c/o sob, panic attack that woke her up from her sleep. Pt states her face and hands are tingling and hurt. Upon initial assessment, pt was hyperventilating, slow deep breaths encouraged, pt tearful.

## 2015-07-05 NOTE — ED Provider Notes (Addendum)
[facility] Emergency Department Provider Note     Time seen: ----------------------------------------- 7:26 AM on 07/05/2015 -----------------------------------------    I have reviewed the triage vital signs and the nursing notes.   HISTORY  Chief Complaint Shortness of Breath and Panic Attack    HPI Crystal Villegas is a 29 y.o. female who presents ER for shortness of breath. Patient states panic attack woke her up from sleep. States her face and hands are tingling in her. She was hyperventilating stating that it hurt for her to breathe. Patient brought in tearful, states she has seen a pulmonologist in the past. She was told that her chest was inflamed but was not sure the exact diagnosis.   No past medical history on file.  There are no active problems to display for this patient.   No past surgical history on file.  Allergies Review of patient's allergies indicates not on file.  Social History Social History  Substance Use Topics  . Smoking status: Not on file  . Smokeless tobacco: Not on file  . Alcohol Use: Not on file    Review of Systems Constitutional: Negative for fever. Eyes: Negative for visual changes. ENT: Negative for sore throat. Cardiovascular: Positive for chest pain Respiratory: Positive for shortness of breath Gastrointestinal: Negative for abdominal pain, vomiting and diarrhea. Genitourinary: Negative for dysuria. Musculoskeletal: Negative for back pain. Skin: Negative for rash. Neurological: Negative for headaches, positive for tingling  10-point ROS otherwise negative.  ____________________________________________   PHYSICAL EXAM:  VITAL SIGNS: ED Triage Vitals  Enc Vitals Group     BP 07/05/15 0719 128/89 mmHg     Pulse Rate 07/05/15 0719 97     Resp 07/05/15 0719 26     Temp 07/05/15 0719 98 F (36.7 C)     Temp Source 07/05/15 0719 Oral     SpO2 --      Weight 07/05/15 0719 112 lb 7 oz (51.001 kg)      Height 07/05/15 0725  (1.499 m)     Head Cir --      Peak Flow --      Pain Score --      Pain Loc --      Pain Edu? --      Excl. in GC? --     Constitutional: Alert and oriented. Mild distress, very anxious and tearful Eyes: Conjunctivae are normal. PERRL. Normal extraocular movements. ENT   Head: Normocephalic and atraumatic.   Nose: No congestion/rhinnorhea.   Mouth/Throat: Mucous membranes are moist.   Neck: No stridor. Cardiovascular: Normal rate, regular rhythm. Normal and symmetric distal pulses are present in all extremities. No murmurs, rubs, or gallops. Respiratory: Normal respiratory effort without tachypnea nor retractions. Breath sounds are clear and equal bilaterally. No wheezes/rales/rhonchi. Gastrointestinal: Soft and nontender. No distention. No abdominal bruits.  Musculoskeletal: Nontender with normal range of motion in all extremities. No joint effusions.  No lower extremity tenderness nor edema. Neurologic:  Normal speech and language. No gross focal neurologic deficits are appreciated. Speech is normal. No gait instability. Skin:  Skin is warm, dry and intact. No rash noted. Psychiatric: Patient sobbing and crying in the room. This appears to be anxiety related. ____________________________________________  EKG: Interpreted by me. Sinus tachycardia with rate of 70 bpm, normal PR interval, normal gross with, normal QT T interval. Nonspecific T-wave changes.  ____________________________________________  ED COURSE:  Pertinent labs & imaging results that were available during my care of the patient were reviewed by  me and considered in my medical decision making (see chart for details). Patient appears to be having a panic attack. She received oral Valium and Motrin. ____________________________________________    RADIOLOGY Images were viewed by me  Chest x-ray is unremarkable  ____________________________________________  FINAL  ASSESSMENT AND PLAN  Panic attack  Plan: Patient with labs and imaging as dictated above. Patient will continue home with Valium to take as needed, she is currently stable for discharge. EKG is unchanged from prior.   Emily Filbert, MD   Emily Filbert, MD 07/05/15 1610  Emily Filbert, MD 07/05/15 [phone]

## 2016-01-03 ENCOUNTER — Other Ambulatory Visit: Payer: Self-pay | Admitting: Neurology

## 2016-01-03 DIAGNOSIS — R6889 Other general symptoms and signs: Principal | ICD-10-CM

## 2016-01-03 DIAGNOSIS — IMO0001 Reserved for inherently not codable concepts without codable children: Secondary | ICD-10-CM

## 2016-01-19 ENCOUNTER — Ambulatory Visit
Admission: RE | Admit: 2016-01-19 | Discharge: 2016-01-19 | Disposition: A | Discharge status: Home / Self Care | Payer: BLUE CROSS/BLUE SHIELD | Source: Ambulatory Visit | Attending: Neurology | Admitting: Neurology

## 2016-01-19 DIAGNOSIS — R6889 Other general symptoms and signs: Secondary | ICD-10-CM | POA: Diagnosis not present

## 2016-01-19 DIAGNOSIS — IMO0001 Reserved for inherently not codable concepts without codable children: Secondary | ICD-10-CM

## 2016-01-19 LAB — POCT I-STAT CREATININE: Creatinine, Ser: 0.9 mg/dL (ref 0.44–1.00)

## 2016-01-19 MED ORDER — GADOBENATE DIMEGLUMINE 529 MG/ML IV SOLN
10.0000 mL | Freq: Once | INTRAVENOUS | Status: AC | PRN
  Administered 2016-01-19: 10 mL via INTRAVENOUS

## 2016-04-23 ENCOUNTER — Emergency Department
Admission: EM | Admit: 2016-04-23 | Discharge: 2016-04-23 | Disposition: A | Discharge status: Home / Self Care | Payer: Medicaid Other | Attending: Emergency Medicine | Admitting: Emergency Medicine

## 2016-04-23 ENCOUNTER — Emergency Department: Payer: Medicaid Other

## 2016-04-23 ENCOUNTER — Encounter: Payer: Self-pay | Admitting: Emergency Medicine

## 2016-04-23 DIAGNOSIS — R6884 Jaw pain: Secondary | ICD-10-CM | POA: Diagnosis not present

## 2016-04-23 DIAGNOSIS — M25571 Pain in right ankle and joints of right foot: Secondary | ICD-10-CM

## 2016-04-23 DIAGNOSIS — J45909 Unspecified asthma, uncomplicated: Secondary | ICD-10-CM | POA: Insufficient documentation

## 2016-04-23 DIAGNOSIS — S60512A Abrasion of left hand, initial encounter: Secondary | ICD-10-CM | POA: Diagnosis not present

## 2016-04-23 DIAGNOSIS — M25561 Pain in right knee: Secondary | ICD-10-CM | POA: Insufficient documentation

## 2016-04-23 DIAGNOSIS — Z7982 Long term (current) use of aspirin: Secondary | ICD-10-CM | POA: Diagnosis not present

## 2016-04-23 DIAGNOSIS — M419 Scoliosis, unspecified: Secondary | ICD-10-CM | POA: Insufficient documentation

## 2016-04-23 DIAGNOSIS — Y999 Unspecified external cause status: Secondary | ICD-10-CM | POA: Insufficient documentation

## 2016-04-23 DIAGNOSIS — Y92410 Unspecified street and highway as the place of occurrence of the external cause: Secondary | ICD-10-CM | POA: Insufficient documentation

## 2016-04-23 DIAGNOSIS — R0789 Other chest pain: Secondary | ICD-10-CM | POA: Diagnosis not present

## 2016-04-23 DIAGNOSIS — Y9389 Activity, other specified: Secondary | ICD-10-CM | POA: Diagnosis not present

## 2016-04-23 DIAGNOSIS — R55 Syncope and collapse: Secondary | ICD-10-CM | POA: Insufficient documentation

## 2016-04-23 DIAGNOSIS — S29012A Strain of muscle and tendon of back wall of thorax, initial encounter: Secondary | ICD-10-CM | POA: Insufficient documentation

## 2016-04-23 DIAGNOSIS — Z87891 Personal history of nicotine dependence: Secondary | ICD-10-CM | POA: Diagnosis not present

## 2016-04-23 DIAGNOSIS — Z79899 Other long term (current) drug therapy: Secondary | ICD-10-CM | POA: Diagnosis not present

## 2016-04-23 DIAGNOSIS — I10 Essential (primary) hypertension: Secondary | ICD-10-CM | POA: Diagnosis not present

## 2016-04-23 DIAGNOSIS — T07XXXA Unspecified multiple injuries, initial encounter: Secondary | ICD-10-CM

## 2016-04-23 DIAGNOSIS — S39012A Strain of muscle, fascia and tendon of lower back, initial encounter: Secondary | ICD-10-CM

## 2016-04-23 DIAGNOSIS — M79602 Pain in left arm: Secondary | ICD-10-CM

## 2016-04-23 DIAGNOSIS — M549 Dorsalgia, unspecified: Secondary | ICD-10-CM | POA: Diagnosis present

## 2016-04-23 DIAGNOSIS — S3991XA Unspecified injury of abdomen, initial encounter: Secondary | ICD-10-CM | POA: Insufficient documentation

## 2016-04-23 LAB — CBC
HCT: 34 % — ABNORMAL LOW (ref 35.0–47.0)
Hemoglobin: 11.4 g/dL — ABNORMAL LOW (ref 12.0–16.0)
MCH: 29.9 pg (ref 26.0–34.0)
MCHC: 33.7 g/dL (ref 32.0–36.0)
MCV: 88.9 fL (ref 80.0–100.0)
Platelets: 281 10*3/uL (ref 150–440)
RBC: 3.82 MIL/uL (ref 3.80–5.20)
RDW: 15 % — ABNORMAL HIGH (ref 11.5–14.5)
WBC: 4.4 10*3/uL (ref 3.6–11.0)

## 2016-04-23 LAB — BASIC METABOLIC PANEL
Anion gap: 7 (ref 5–15)
BUN: 8 mg/dL (ref 6–20)
CO2: 23 mmol/L (ref 22–32)
Calcium: 9.3 mg/dL (ref 8.9–10.3)
Chloride: 105 mmol/L (ref 101–111)
Creatinine, Ser: 0.91 mg/dL (ref 0.44–1.00)
GFR calc Af Amer: >60 mL/min (ref >60)
GFR calc non Af Amer: >60 mL/min (ref >60)
Glucose, Bld: 91 mg/dL (ref 65–99)
Potassium: 3.7 mmol/L (ref 3.5–5.1)
Sodium: 135 mmol/L (ref 135–145)

## 2016-04-23 LAB — HCG, QUANTITATIVE, PREGNANCY: hCG, Beta Chain, Quant, S: <1 m[IU]/mL (ref <5)

## 2016-04-23 MED ORDER — FENTANYL CITRATE (PF) 100 MCG/2ML IJ SOLN
50.0000 ug | Freq: Once | INTRAMUSCULAR | Status: AC
  Administered 2016-04-23: 50 ug via INTRAVENOUS
  Filled 2016-04-23: qty 2

## 2016-04-23 MED ORDER — IOPAMIDOL (ISOVUE-300) INJECTION 61%
100.0000 mL | Freq: Once | INTRAVENOUS | Status: AC | PRN
  Administered 2016-04-23: 100 mL via INTRAVENOUS
  Filled 2016-04-23: qty 100

## 2016-04-23 MED ORDER — IBUPROFEN 800 MG PO TABS: 800.0000 mg | ORAL_TABLET | Freq: Three times a day (TID) | ORAL | Status: DC | PRN

## 2016-04-23 MED ORDER — SODIUM CHLORIDE 0.9 % IV BOLUS (SEPSIS)
1000.0000 mL | Freq: Once | INTRAVENOUS | Status: AC
  Administered 2016-04-23: 1000 mL via INTRAVENOUS

## 2016-04-23 MED ORDER — OXYCODONE-ACETAMINOPHEN 5-325 MG PO TABS: 1.0000 | ORAL_TABLET | ORAL | Status: AC | PRN

## 2016-04-23 NOTE — Discharge Instructions (Signed)
Please take Motrin for mild to moderate pain and Percocet for severe pain.  Do not drive within 8 hours of taking Percocet.  You may use a heating pad or ice for 10 minutes every 2 hours to decrease pain in your back, left arm and right leg.  Return to the emergency department for severe pain, vomiting, numbness tingling or weakness, or for any other symptoms concerning to you.
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
## 2016-10-14 DIAGNOSIS — F449 Dissociative and conversion disorder, unspecified: Secondary | ICD-10-CM

## 2016-10-14 HISTORY — DX: Dissociative and conversion disorder, unspecified: F44.9

## 2017-06-12 ENCOUNTER — Emergency Department: Payer: Medicaid Other

## 2017-06-12 ENCOUNTER — Observation Stay
Admission: EM | Admit: 2017-06-12 | Discharge: 2017-06-13 | Disposition: A | Discharge status: Home / Self Care | Payer: Medicaid Other | Attending: Internal Medicine | Admitting: Internal Medicine

## 2017-06-12 ENCOUNTER — Encounter: Payer: Self-pay | Admitting: Emergency Medicine

## 2017-06-12 DIAGNOSIS — F1410 Cocaine abuse, uncomplicated: Secondary | ICD-10-CM | POA: Insufficient documentation

## 2017-06-12 DIAGNOSIS — I10 Essential (primary) hypertension: Secondary | ICD-10-CM | POA: Insufficient documentation

## 2017-06-12 DIAGNOSIS — Z79899 Other long term (current) drug therapy: Secondary | ICD-10-CM | POA: Insufficient documentation

## 2017-06-12 DIAGNOSIS — F17200 Nicotine dependence, unspecified, uncomplicated: Secondary | ICD-10-CM | POA: Insufficient documentation

## 2017-06-12 DIAGNOSIS — H5712 Ocular pain, left eye: Secondary | ICD-10-CM | POA: Diagnosis not present

## 2017-06-12 DIAGNOSIS — R569 Unspecified convulsions: Secondary | ICD-10-CM

## 2017-06-12 DIAGNOSIS — J0100 Acute maxillary sinusitis, unspecified: Secondary | ICD-10-CM | POA: Insufficient documentation

## 2017-06-12 DIAGNOSIS — R4182 Altered mental status, unspecified: Principal | ICD-10-CM | POA: Insufficient documentation

## 2017-06-12 DIAGNOSIS — M419 Scoliosis, unspecified: Secondary | ICD-10-CM | POA: Insufficient documentation

## 2017-06-12 DIAGNOSIS — R4781 Slurred speech: Secondary | ICD-10-CM | POA: Diagnosis not present

## 2017-06-12 DIAGNOSIS — G40909 Epilepsy, unspecified, not intractable, without status epilepticus: Secondary | ICD-10-CM | POA: Diagnosis not present

## 2017-06-12 DIAGNOSIS — Z7982 Long term (current) use of aspirin: Secondary | ICD-10-CM | POA: Insufficient documentation

## 2017-06-12 DIAGNOSIS — J45909 Unspecified asthma, uncomplicated: Secondary | ICD-10-CM | POA: Insufficient documentation

## 2017-06-12 DIAGNOSIS — F1910 Other psychoactive substance abuse, uncomplicated: Secondary | ICD-10-CM

## 2017-06-12 DIAGNOSIS — I959 Hypotension, unspecified: Secondary | ICD-10-CM | POA: Diagnosis not present

## 2017-06-12 DIAGNOSIS — Z9114 Patient's other noncompliance with medication regimen: Secondary | ICD-10-CM | POA: Diagnosis not present

## 2017-06-12 LAB — COMPREHENSIVE METABOLIC PANEL
ALBUMIN: 4.8 g/dL (ref 3.5–5.0)
ALT: 13 U/L — ABNORMAL LOW (ref 14–54)
ANION GAP: 11 (ref 5–15)
AST: 24 U/L (ref 15–41)
Alkaline Phosphatase: 47 U/L (ref 38–126)
BILIRUBIN TOTAL: 0.3 mg/dL (ref 0.3–1.2)
BUN: 11 mg/dL (ref 6–20)
CHLORIDE: 107 mmol/L (ref 101–111)
CO2: 20 mmol/L — AB (ref 22–32)
CREATININE: 0.83 mg/dL (ref 0.44–1.00)
Calcium: 9.4 mg/dL (ref 8.9–10.3)
GFR calc Af Amer: >60 mL/min (ref >60)
GLUCOSE: 107 mg/dL — AB (ref 65–99)
POTASSIUM: 3.6 mmol/L (ref 3.5–5.1)
Sodium: 138 mmol/L (ref 135–145)
Total Protein: 8.4 g/dL — ABNORMAL HIGH (ref 6.5–8.1)

## 2017-06-12 LAB — URINALYSIS, COMPLETE (UACMP) WITH MICROSCOPIC
Bilirubin Urine: NEGATIVE
GLUCOSE, UA: NEGATIVE mg/dL
KETONES UR: NEGATIVE mg/dL
Leukocytes, UA: NEGATIVE
Nitrite: NEGATIVE
PH: 6 (ref 5.0–8.0)
PROTEIN: 100 mg/dL — AB
Specific Gravity, Urine: 1.003 — ABNORMAL LOW (ref 1.005–1.030)

## 2017-06-12 LAB — URINE DRUG SCREEN, QUALITATIVE (ARMC ONLY)
Amphetamines, Ur Screen: NOT DETECTED
BENZODIAZEPINE, UR SCRN: NOT DETECTED
Barbiturates, Ur Screen: NOT DETECTED
CANNABINOID 50 NG, UR ~~LOC~~: NOT DETECTED
Cocaine Metabolite,Ur ~~LOC~~: POSITIVE — AB
MDMA (Ecstasy)Ur Screen: NOT DETECTED
Methadone Scn, Ur: NOT DETECTED
OPIATE, UR SCREEN: NOT DETECTED
PHENCYCLIDINE (PCP) UR S: NOT DETECTED
Tricyclic, Ur Screen: NOT DETECTED

## 2017-06-12 LAB — ETHANOL: Alcohol, Ethyl (B): 109 mg/dL — ABNORMAL HIGH (ref <5)

## 2017-06-12 LAB — CBC
HCT: 32 % — ABNORMAL LOW (ref 35.0–47.0)
Hemoglobin: 10 g/dL — ABNORMAL LOW (ref 12.0–16.0)
MCH: 25.3 pg — AB (ref 26.0–34.0)
MCHC: 31.2 g/dL — AB (ref 32.0–36.0)
MCV: 81.3 fL (ref 80.0–100.0)
PLATELETS: 421 10*3/uL (ref 150–440)
RBC: 3.94 MIL/uL (ref 3.80–5.20)
RDW: 18.3 % — AB (ref 11.5–14.5)
WBC: 6 10*3/uL (ref 3.6–11.0)

## 2017-06-12 LAB — POCT PREGNANCY, URINE: PREG TEST UR: NEGATIVE

## 2017-06-12 LAB — ACETAMINOPHEN LEVEL: Acetaminophen (Tylenol), Serum: <10 ug/mL — ABNORMAL LOW (ref 10–30)

## 2017-06-12 LAB — SALICYLATE LEVEL

## 2017-06-12 MED ORDER — DIAZEPAM 5 MG PO TABS
5.0000 mg | ORAL_TABLET | Freq: Three times a day (TID) | ORAL | Status: DC | PRN
  Administered 2017-06-12: 5 mg via ORAL
  Filled 2017-06-12: qty 1

## 2017-06-12 MED ORDER — MOMETASONE FURO-FORMOTEROL FUM 200-5 MCG/ACT IN AERO
2.0000 | INHALATION_SPRAY | Freq: Two times a day (BID) | RESPIRATORY_TRACT | Status: DC
Start: 2017-06-12 — End: 2017-06-13
  Administered 2017-06-12 – 2017-06-13 (×2): 2 via RESPIRATORY_TRACT
  Filled 2017-06-12: qty 8.8

## 2017-06-12 MED ORDER — SODIUM CHLORIDE 0.9 % IV BOLUS (SEPSIS)
1000.0000 mL | Freq: Once | INTRAVENOUS | Status: AC
Start: 2017-06-12 — End: 2017-06-12
  Administered 2017-06-12: 1000 mL via INTRAVENOUS

## 2017-06-12 MED ORDER — FAMOTIDINE 20 MG PO TABS
20.0000 mg | ORAL_TABLET | Freq: Every day | ORAL | Status: DC
  Administered 2017-06-12 – 2017-06-13 (×2): 20 mg via ORAL
  Filled 2017-06-12 (×2): qty 1

## 2017-06-12 MED ORDER — AMOXICILLIN-POT CLAVULANATE 875-125 MG PO TABS
1.0000 | ORAL_TABLET | Freq: Two times a day (BID) | ORAL | Status: DC
  Administered 2017-06-12 – 2017-06-13 (×2): 1 via ORAL
  Filled 2017-06-12 (×2): qty 1

## 2017-06-12 MED ORDER — ALBUTEROL SULFATE (2.5 MG/3ML) 0.083% IN NEBU
2.5000 mg | INHALATION_SOLUTION | RESPIRATORY_TRACT | Status: DC | PRN
Start: 2017-06-12 — End: 2017-06-13
  Administered 2017-06-12: 2.5 mg via RESPIRATORY_TRACT

## 2017-06-12 MED ORDER — IBUPROFEN 400 MG PO TABS: 600.0000 mg | ORAL_TABLET | Freq: Three times a day (TID) | ORAL | Status: DC | PRN

## 2017-06-12 MED ORDER — ASPIRIN EC 81 MG PO TBEC
81.0000 mg | DELAYED_RELEASE_TABLET | Freq: Every day | ORAL | Status: DC
  Administered 2017-06-12 – 2017-06-13 (×2): 81 mg via ORAL
  Filled 2017-06-12 (×2): qty 1

## 2017-06-12 MED ORDER — POLYETHYLENE GLYCOL 3350 17 G PO PACK: 17.0000 g | PACK | Freq: Every day | ORAL | Status: DC | PRN

## 2017-06-12 MED ORDER — ONDANSETRON HCL 4 MG PO TABS: 4.0000 mg | ORAL_TABLET | Freq: Four times a day (QID) | ORAL | Status: DC | PRN

## 2017-06-12 MED ORDER — ACETAMINOPHEN 650 MG RE SUPP: 650.0000 mg | Freq: Four times a day (QID) | RECTAL | Status: DC | PRN

## 2017-06-12 MED ORDER — ALBUTEROL SULFATE (2.5 MG/3ML) 0.083% IN NEBU
INHALATION_SOLUTION | RESPIRATORY_TRACT | Status: AC
  Filled 2017-06-12: qty 3

## 2017-06-12 MED ORDER — LORAZEPAM 2 MG/ML IJ SOLN: 1.0000 mg | INTRAMUSCULAR | Status: DC | PRN

## 2017-06-12 MED ORDER — PNEUMOCOCCAL VAC POLYVALENT 25 MCG/0.5ML IJ INJ
0.5000 mL | INJECTION | INTRAMUSCULAR | Status: DC
  Filled 2017-06-12: qty 0.5

## 2017-06-12 MED ORDER — ONDANSETRON HCL 4 MG/2ML IJ SOLN: 4.0000 mg | Freq: Four times a day (QID) | INTRAMUSCULAR | Status: DC | PRN

## 2017-06-12 MED ORDER — THIAMINE HCL 100 MG/ML IJ SOLN
Freq: Once | INTRAVENOUS | Status: AC
  Administered 2017-06-12: 17:00:00 via INTRAVENOUS
  Filled 2017-06-12: qty 1000

## 2017-06-12 MED ORDER — ADULT MULTIVITAMIN W/MINERALS CH
1.0000 | ORAL_TABLET | Freq: Every day | ORAL | Status: DC
  Administered 2017-06-12 – 2017-06-13 (×2): 1 via ORAL
  Filled 2017-06-12 (×2): qty 1

## 2017-06-12 MED ORDER — DOCUSATE SODIUM 100 MG PO CAPS
100.0000 mg | ORAL_CAPSULE | Freq: Two times a day (BID) | ORAL | Status: DC
  Administered 2017-06-12 – 2017-06-13 (×2): 100 mg via ORAL
  Filled 2017-06-12 (×2): qty 1

## 2017-06-12 MED ORDER — ACETAMINOPHEN 325 MG PO TABS
650.0000 mg | ORAL_TABLET | Freq: Four times a day (QID) | ORAL | Status: DC | PRN
  Administered 2017-06-12 – 2017-06-13 (×2): 650 mg via ORAL
  Filled 2017-06-12 (×2): qty 2

## 2017-06-12 MED ORDER — LORAZEPAM 2 MG/ML IJ SOLN
1.0000 mg | Freq: Four times a day (QID) | INTRAMUSCULAR | Status: DC | PRN
  Administered 2017-06-12: 1 mg via INTRAVENOUS
  Filled 2017-06-12: qty 1

## 2017-06-12 MED ORDER — LAMOTRIGINE 25 MG PO TABS
25.0000 mg | ORAL_TABLET | Freq: Once | ORAL | Status: AC
  Administered 2017-06-12: 25 mg via ORAL
  Filled 2017-06-12: qty 1

## 2017-06-12 MED ORDER — ENOXAPARIN SODIUM 40 MG/0.4ML ~~LOC~~ SOLN
40.0000 mg | SUBCUTANEOUS | Status: DC
  Administered 2017-06-12: 40 mg via SUBCUTANEOUS
  Filled 2017-06-12: qty 0.4

## 2017-06-12 MED ORDER — LORAZEPAM 1 MG PO TABS: 1.0000 mg | ORAL_TABLET | Freq: Four times a day (QID) | ORAL | Status: DC | PRN

## 2017-06-12 MED ORDER — NICOTINE 7 MG/24HR TD PT24
7.0000 mg | MEDICATED_PATCH | Freq: Every day | TRANSDERMAL | Status: DC
  Filled 2017-06-12 (×2): qty 1
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
## 2017-06-12 NOTE — ED Notes (Signed)
Pt EKG performed, given to MD and exported. Pt vitals taken. Pt changed into gown and covered with sheet. Yellow socks placed on pt and pt placed on bed alarm.
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
## 2017-06-12 NOTE — ED Notes (Signed)
Patient assisted to use restroom. Ambulatory with standby assist. Patient repositioned in bed and hooked to cardiac monitor.

## 2017-06-13 ENCOUNTER — Observation Stay: Payer: Medicaid Other

## 2017-06-13 DIAGNOSIS — R569 Unspecified convulsions: Secondary | ICD-10-CM

## 2017-06-13 MED ORDER — GADOBENATE DIMEGLUMINE 529 MG/ML IV SOLN
10.0000 mL | Freq: Once | INTRAVENOUS | Status: AC | PRN
  Administered 2017-06-13: 10 mL via INTRAVENOUS

## 2017-06-13 MED ORDER — LAMOTRIGINE 25 MG PO TABS
25.0000 mg | ORAL_TABLET | Freq: Two times a day (BID) | ORAL | Status: DC
  Administered 2017-06-13: 10:00:00 25 mg via ORAL
  Filled 2017-06-13: qty 1

## 2017-06-13 MED ORDER — AMOXICILLIN-POT CLAVULANATE 875-125 MG PO TABS: 1.0000 | ORAL_TABLET | Freq: Two times a day (BID) | ORAL | 0 refills | Status: DC

## 2017-06-13 MED ORDER — LAMOTRIGINE 25 MG PO TABS: 25.0000 mg | ORAL_TABLET | Freq: Two times a day (BID) | ORAL | 1 refills | Status: DC

## 2017-06-13 MED ORDER — LAMOTRIGINE 25 MG PO TABS: 25.0000 mg | ORAL_TABLET | Freq: Every day | ORAL | Status: DC

## 2017-06-13 NOTE — Discharge Summary (Addendum)
[facility] [place] [facility]   PATIENT NAME: Crystal Villegas    MR#:  [id number]  DATE OF BIRTH:  1986/02/11  DATE OF ADMISSION:  06/12/2017   ADMITTING PHYSICIAN: Ramonita Lab, MD  DATE OF DISCHARGE:  06/13/2017  PRIMARY CARE PHYSICIAN: Thornton Papas, PA-C   ADMISSION DIAGNOSIS:  Polysubstance abuse [F19.10] Post-ictal state (HCC) [R56.9] DISCHARGE DIAGNOSIS:  Active Problems:   Seizure (HCC)  SECONDARY DIAGNOSIS:   Past Medical History:  Diagnosis Date  . Asthma   . Hypertension   . Scoliosis   . Seizures [facility])    HOSPITAL COURSE:   Crystal Villegas  is a 31 y.o. female with a known history of  Seizures, stopped taking them her medications for 1 month, hypertension is presenting to the ED with a chief complaint of altered mental status. Patient is brought into the ED by her husband.  # Altered mental status possibly secondary to breakthrough seizures from noncompliance with the medications-has chronic history of seizures Lamictal 1 dose was given in the emergency department, resumed her home medication Lamictal. No seizure since admission. MRI brain is unremarkable and EEG is normal. Ativan as needed for breakthrough seizures CT head is negative The patient condition discharged to home today per Dr. Thad Ranger.  #Acute maxillary sinusitis Augmentin  #Hypotension with history of hypertension Provide IV fluids and monitor blood pressure closely Improved.  #Drug abuse Urine drug screen is positive for cocaine Patient will be benefited with outpatient drug rehabilitation center follow-up  #Tobacco abuse disorder Counseled patient to stop smoking, on nicotine patch.  GI prophylaxis with Pepcid Discussed with Dr. Thad Ranger. DISCHARGE CONDITIONS:  Stable, discharge to home today. CONSULTS OBTAINED:  Treatment Team:  Thana Farr, MD DRUG ALLERGIES:  No Known Allergies DISCHARGE MEDICATIONS:   Allergies as of 06/13/2017   No  Known Allergies     Medication List    STOP taking these medications   diltiazem 120 MG 24 hr capsule Commonly known as:  CARDIZEM CD     TAKE these medications   ADVAIR DISKUS 250-50 MCG/DOSE Aepb Generic drug:  Fluticasone-Salmeterol Inhale 1 puff into the lungs 2 (two) times daily.   albuterol (2.5 MG/3ML) 0.083% nebulizer solution Commonly known as:  PROVENTIL Inhale 3 mLs into the lungs every 6 (six) hours as needed.   amoxicillin-clavulanate 875-125 MG tablet Commonly known as:  AUGMENTIN Take 1 tablet by mouth every 12 (twelve) hours.   aspirin EC 81 MG tablet Take 81 mg by mouth daily.   diazepam 5 MG tablet Commonly known as:  VALIUM Take 1 tablet (5 mg total) by mouth every 8 (eight) hours as needed for muscle spasms.   docusate sodium 100 MG capsule Commonly known as:  COLACE Take 100 mg by mouth 2 (two) times daily.   ibuprofen 800 MG tablet Commonly known as:  ADVIL,MOTRIN Take 1 tablet (800 mg total) by mouth every 8 (eight) hours as needed.   lamoTRIgine 25 MG tablet Commonly known as:  LAMICTAL Take 1 tablet (25 mg total) by mouth 2 (two) times daily.   ONE-A-DAY WOMENS FORMULA Tabs Take 1 tablet by mouth daily.            Discharge Care Instructions        Start     Ordered   06/13/17 0000  Increase activity slowly     06/13/17 0905   06/13/17 0000  Diet - low sodium heart healthy     06/13/17 0905  06/13/17 0000  amoxicillin-clavulanate (AUGMENTIN) 875-125 MG tablet  Every 12 hours     06/13/17 0910   06/13/17 0000  lamoTRIgine (LAMICTAL) 25 MG tablet  2 times daily     06/13/17 0920       DISCHARGE INSTRUCTIONS:  See AVS.  If you experience worsening of your admission symptoms, develop shortness of breath, life threatening emergency, suicidal or homicidal thoughts you must seek medical attention immediately by calling 911 or calling your MD immediately  if symptoms less severe.  You Must read complete instructions/literature  along with all the possible adverse reactions/side effects for all the Medicines you take and that have been prescribed to you. Take any new Medicines after you have completely understood and accpet all the possible adverse reactions/side effects.   Please note  You were cared for by a hospitalist during your hospital stay. If you have any questions about your discharge medications or the care you received while you were in the hospital after you are discharged, you can call the unit and asked to speak with the hospitalist on call if the hospitalist that took care of you is not available. Once you are discharged, your primary care physician will handle any further medical issues. Please note that NO REFILLS for any discharge medications will be authorized once you are discharged, as it is imperative that you return to your primary care physician (or establish a relationship with a primary care physician if you do not have one) for your aftercare needs so that they can reassess your need for medications and monitor your lab values.    On the day of Discharge:  VITAL SIGNS:  Blood pressure 110/73, pulse (!) 58, temperature 98.2 F (36.8 C), temperature source Oral, resp. rate 16, height 5' (1.524 m), weight 108 lb 0.4 oz (49 kg), last menstrual period 06/11/2017, SpO2 100 %. PHYSICAL EXAMINATION:  GENERAL:  31 y.o.-year-old patient lying in the bed with no acute distress.  EYES: Pupils equal, round, reactive to light and accommodation. No scleral icterus. Extraocular muscles intact.  HEENT: Head atraumatic, normocephalic. Oropharynx and nasopharynx clear.  NECK:  Supple, no jugular venous distention. No thyroid enlargement, no tenderness.  LUNGS: Normal breath sounds bilaterally, no wheezing, rales,rhonchi or crepitation. No use of accessory muscles of respiration.  CARDIOVASCULAR: S1, S2 normal. No murmurs, rubs, or gallops.  ABDOMEN: Soft, non-tender, non-distended. Bowel sounds present. No  organomegaly or mass.  EXTREMITIES: No pedal edema, cyanosis, or clubbing.  NEUROLOGIC: Cranial nerves II through XII are intact. Muscle strength 5/5 in all extremities. Sensation intact. Gait not checked.  PSYCHIATRIC: The patient is alert and oriented x 3.  SKIN: No obvious rash, lesion, or ulcer.  DATA REVIEW:   CBC  Recent Labs Lab 06/12/17 0812  WBC 6.0  HGB 10.0*  HCT 32.0*  PLT 421    Chemistries   Recent Labs Lab 06/12/17 0812  NA 138  K 3.6  CL 107  CO2 20*  GLUCOSE 107*  BUN 11  CREATININE 0.83  CALCIUM 9.4  AST 24  ALT 13*  ALKPHOS 47  BILITOT 0.3     Microbiology Results  No results found for this or any previous visit.  RADIOLOGY:  Mr Laqueta JeanBrain W Wo Contrast  Result Date: 06/13/2017 CLINICAL DATA:  Initial evaluation for seizure, altered mental status. History of seizures. EXAM: MRI HEAD WITHOUT AND WITH CONTRAST TECHNIQUE: Multiplanar, multiecho pulse sequences of the brain and surrounding structures were obtained without and with intravenous contrast. CONTRAST:  10mL MULTIHANCE GADOBENATE DIMEGLUMINE 529 MG/ML IV SOLN COMPARISON:  Previous MRI from 01/19/2016. FINDINGS: Brain: Cerebral volume within normal limits for age. No focal parenchymal signal abnormality identified. No abnormal foci of restricted diffusion to suggest acute or subacute ischemia. Gray-white matter differentiation well maintained. No encephalomalacia to suggest chronic infarction. No foci of susceptibility artifact to suggest acute or chronic intracranial hemorrhage. No mass lesion, midline shift or mass effect. Ventricles normal size without evidence for hydrocephalus. No extra-axial fluid collection. Major dural sinuses grossly patent. No intrinsic temporal lobe abnormality. No abnormal enhancement. Pituitary gland suprasellar region within normal limits. Vascular: Major intracranial vascular flow voids are well maintained and are normal in appearance. Skull and upper cervical spine:  Craniocervical junction within normal limits. Visualized upper cervical spine unremarkable. Bone marrow signal intensity within normal limits. No scalp soft tissue abnormality. Sinuses/Orbits: Globes and orbital soft tissues within normal limits. Scattered mucosal thickening within the ethmoidal air cells and maxillary sinuses. No air-fluid level to suggest acute sinusitis. No mastoid effusion. Inner ear structures normal. Other: None. IMPRESSION: Stable normal MRI appearance of the brain. No acute intracranial process identified. Electronically Signed   By: Rise Mu M.D.   On: 06/13/2017 12:58     Management plans discussed with the patient, her husband and they are in agreement.  CODE STATUS: Full Code   TOTAL TIME TAKING CARE OF THIS PATIENT: 36 minutes.    Shaune Pollack M.D on 06/13/2017 at 2:31 PM  Between 7am to 6pm - Pager - [phone]  After 6pm go to [email or web address] - password [email or web address] [facility]  [facility] [place] [facility]  Office  [phone]  CC: Primary care physician; Thornton Papas, PA-C   Note: This dictation was prepared with Dragon dictation along with smaller phrase technology. Any transcriptional errors that result from this process are unintentional.

## 2017-06-13 NOTE — Progress Notes (Signed)
Pt being discharged home, discharge instructions and prescriptions reviewed with pt and husband, states understanding, pt with no complaints
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
## 2017-06-13 NOTE — Discharge Instructions (Signed)
Seizure precaution. Smoking and drug (cocain) cessation.

## 2017-06-14 LAB — HIV ANTIBODY (ROUTINE TESTING W REFLEX): HIV SCREEN 4TH GENERATION: NONREACTIVE

## 2017-06-15 LAB — LAMOTRIGINE LEVEL: Lamotrigine Lvl: NOT DETECTED ug/mL (ref 2.0–20.0)

## 2017-06-21 ENCOUNTER — Emergency Department
Admission: EM | Admit: 2017-06-21 | Discharge: 2017-06-21 | Discharge status: Against Medical Advice | Payer: Medicaid Other | Attending: Emergency Medicine | Admitting: Emergency Medicine

## 2017-06-21 ENCOUNTER — Encounter: Payer: Self-pay | Admitting: Emergency Medicine

## 2017-06-21 ENCOUNTER — Inpatient Hospital Stay
Admission: EM | Admit: 2017-06-21 | Discharge: 2017-06-24 | DRG: 093 | Disposition: A | Discharge status: Home / Self Care | Payer: Medicaid Other | Attending: Internal Medicine | Admitting: Internal Medicine

## 2017-06-21 DIAGNOSIS — R471 Dysarthria and anarthria: Principal | ICD-10-CM | POA: Diagnosis present

## 2017-06-21 DIAGNOSIS — M419 Scoliosis, unspecified: Secondary | ICD-10-CM | POA: Diagnosis present

## 2017-06-21 DIAGNOSIS — Z7982 Long term (current) use of aspirin: Secondary | ICD-10-CM

## 2017-06-21 DIAGNOSIS — Z79899 Other long term (current) drug therapy: Secondary | ICD-10-CM | POA: Diagnosis not present

## 2017-06-21 DIAGNOSIS — F17200 Nicotine dependence, unspecified, uncomplicated: Secondary | ICD-10-CM | POA: Insufficient documentation

## 2017-06-21 DIAGNOSIS — M79604 Pain in right leg: Secondary | ICD-10-CM | POA: Insufficient documentation

## 2017-06-21 DIAGNOSIS — I10 Essential (primary) hypertension: Secondary | ICD-10-CM | POA: Diagnosis present

## 2017-06-21 DIAGNOSIS — R4701 Aphasia: Secondary | ICD-10-CM | POA: Diagnosis present

## 2017-06-21 DIAGNOSIS — Z9119 Patient's noncompliance with other medical treatment and regimen: Secondary | ICD-10-CM | POA: Diagnosis not present

## 2017-06-21 DIAGNOSIS — F329 Major depressive disorder, single episode, unspecified: Secondary | ICD-10-CM | POA: Diagnosis present

## 2017-06-21 DIAGNOSIS — G40909 Epilepsy, unspecified, not intractable, without status epilepticus: Secondary | ICD-10-CM | POA: Diagnosis present

## 2017-06-21 DIAGNOSIS — J45909 Unspecified asthma, uncomplicated: Secondary | ICD-10-CM | POA: Diagnosis present

## 2017-06-21 DIAGNOSIS — Z7951 Long term (current) use of inhaled steroids: Secondary | ICD-10-CM

## 2017-06-21 DIAGNOSIS — R569 Unspecified convulsions: Secondary | ICD-10-CM

## 2017-06-21 DIAGNOSIS — N3001 Acute cystitis with hematuria: Secondary | ICD-10-CM | POA: Insufficient documentation

## 2017-06-21 DIAGNOSIS — R4781 Slurred speech: Secondary | ICD-10-CM

## 2017-06-21 DIAGNOSIS — M79605 Pain in left leg: Secondary | ICD-10-CM | POA: Insufficient documentation

## 2017-06-21 DIAGNOSIS — M6281 Muscle weakness (generalized): Secondary | ICD-10-CM | POA: Diagnosis present

## 2017-06-21 DIAGNOSIS — R29898 Other symptoms and signs involving the musculoskeletal system: Secondary | ICD-10-CM

## 2017-06-21 LAB — URINE DRUG SCREEN, QUALITATIVE (ARMC ONLY)
Amphetamines, Ur Screen: NOT DETECTED
BARBITURATES, UR SCREEN: NOT DETECTED
Benzodiazepine, Ur Scrn: POSITIVE — AB
COCAINE METABOLITE, UR ~~LOC~~: NOT DETECTED
Cannabinoid 50 Ng, Ur ~~LOC~~: NOT DETECTED
MDMA (ECSTASY) UR SCREEN: NOT DETECTED
Methadone Scn, Ur: NOT DETECTED
Opiate, Ur Screen: NOT DETECTED
Phencyclidine (PCP) Ur S: NOT DETECTED
Tricyclic, Ur Screen: NOT DETECTED

## 2017-06-21 LAB — CBC WITH DIFFERENTIAL/PLATELET
Basophils Absolute: 0 10*3/uL (ref 0–0.1)
Basophils Relative: 0 %
Eosinophils Absolute: 0.1 10*3/uL (ref 0–0.7)
Eosinophils Relative: 2 %
HEMATOCRIT: 32.9 % — AB (ref 35.0–47.0)
HEMOGLOBIN: 10.7 g/dL — AB (ref 12.0–16.0)
LYMPHS PCT: 37 %
Lymphs Abs: 1.5 10*3/uL (ref 1.0–3.6)
MCH: 26.6 pg (ref 26.0–34.0)
MCHC: 32.5 g/dL (ref 32.0–36.0)
MCV: 81.9 fL (ref 80.0–100.0)
MONO ABS: 0.5 10*3/uL (ref 0.2–0.9)
MONOS PCT: 12 %
NEUTROS ABS: 2 10*3/uL (ref 1.4–6.5)
NEUTROS PCT: 49 %
Platelets: 485 10*3/uL — ABNORMAL HIGH (ref 150–440)
RBC: 4.02 MIL/uL (ref 3.80–5.20)
RDW: 18.7 % — AB (ref 11.5–14.5)
WBC: 4.1 10*3/uL (ref 3.6–11.0)

## 2017-06-21 LAB — COMPREHENSIVE METABOLIC PANEL
ALK PHOS: 53 U/L (ref 38–126)
ALT: 19 U/L (ref 14–54)
AST: 22 U/L (ref 15–41)
Albumin: 4.7 g/dL (ref 3.5–5.0)
Anion gap: 6 (ref 5–15)
BILIRUBIN TOTAL: 0.4 mg/dL (ref 0.3–1.2)
BUN: 15 mg/dL (ref 6–20)
CO2: 27 mmol/L (ref 22–32)
CREATININE: 0.8 mg/dL (ref 0.44–1.00)
Calcium: 9.8 mg/dL (ref 8.9–10.3)
Chloride: 102 mmol/L (ref 101–111)
Glucose, Bld: 89 mg/dL (ref 65–99)
Potassium: 4.2 mmol/L (ref 3.5–5.1)
Sodium: 135 mmol/L (ref 135–145)
TOTAL PROTEIN: 8.7 g/dL — AB (ref 6.5–8.1)

## 2017-06-21 LAB — URINALYSIS, COMPLETE (UACMP) WITH MICROSCOPIC
Bilirubin Urine: NEGATIVE
GLUCOSE, UA: NEGATIVE mg/dL
HGB URINE DIPSTICK: NEGATIVE
KETONES UR: NEGATIVE mg/dL
NITRITE: NEGATIVE
PROTEIN: NEGATIVE mg/dL
Specific Gravity, Urine: 1.029 (ref 1.005–1.030)
pH: 5 (ref 5.0–8.0)

## 2017-06-21 LAB — TSH: TSH: 2.277 u[IU]/mL (ref 0.350–4.500)

## 2017-06-21 LAB — CK: CK TOTAL: 132 U/L (ref 38–234)

## 2017-06-21 LAB — T4, FREE: Free T4: 0.7 ng/dL (ref 0.61–1.12)

## 2017-06-21 LAB — PREGNANCY, URINE: PREG TEST UR: NEGATIVE

## 2017-06-21 LAB — SEDIMENTATION RATE: Sed Rate: 11 mm/hr (ref 0–20)

## 2017-06-21 MED ORDER — ONDANSETRON HCL 4 MG/2ML IJ SOLN: 4.0000 mg | Freq: Four times a day (QID) | INTRAMUSCULAR | Status: DC | PRN

## 2017-06-21 MED ORDER — NITROFURANTOIN MONOHYD MACRO 100 MG PO CAPS: 100.0000 mg | ORAL_CAPSULE | Freq: Two times a day (BID) | ORAL | 0 refills | Status: DC

## 2017-06-21 MED ORDER — MOMETASONE FURO-FORMOTEROL FUM 200-5 MCG/ACT IN AERO
2.0000 | INHALATION_SPRAY | Freq: Two times a day (BID) | RESPIRATORY_TRACT | Status: DC
  Administered 2017-06-22 – 2017-06-24 (×5): 2 via RESPIRATORY_TRACT
  Filled 2017-06-21: qty 8.8

## 2017-06-21 MED ORDER — LAMOTRIGINE 25 MG PO TABS
50.0000 mg | ORAL_TABLET | Freq: Every day | ORAL | Status: DC
  Administered 2017-06-21 – 2017-06-23 (×3): 50 mg via ORAL
  Filled 2017-06-21 (×3): qty 2

## 2017-06-21 MED ORDER — ONDANSETRON HCL 4 MG PO TABS: 4.0000 mg | ORAL_TABLET | Freq: Four times a day (QID) | ORAL | Status: DC | PRN

## 2017-06-21 MED ORDER — LAMOTRIGINE 25 MG PO TABS
25.0000 mg | ORAL_TABLET | Freq: Every morning | ORAL | Status: DC
  Administered 2017-06-22 – 2017-06-24 (×3): 25 mg via ORAL
  Filled 2017-06-21 (×3): qty 1

## 2017-06-21 MED ORDER — ASPIRIN EC 81 MG PO TBEC
81.0000 mg | DELAYED_RELEASE_TABLET | Freq: Every day | ORAL | Status: DC
  Administered 2017-06-21 – 2017-06-24 (×4): 81 mg via ORAL
  Filled 2017-06-21 (×4): qty 1

## 2017-06-21 MED ORDER — ACETAMINOPHEN 650 MG RE SUPP: 650.0000 mg | Freq: Four times a day (QID) | RECTAL | Status: DC | PRN

## 2017-06-21 MED ORDER — ACETAMINOPHEN 325 MG PO TABS
650.0000 mg | ORAL_TABLET | Freq: Four times a day (QID) | ORAL | Status: DC | PRN
  Administered 2017-06-21: 650 mg via ORAL
  Filled 2017-06-21: qty 2

## 2017-06-21 MED ORDER — ALBUTEROL SULFATE (2.5 MG/3ML) 0.083% IN NEBU
3.0000 mL | INHALATION_SOLUTION | Freq: Four times a day (QID) | RESPIRATORY_TRACT | Status: DC | PRN
  Filled 2017-06-21 (×3): qty 3

## 2017-06-21 MED ORDER — ENOXAPARIN SODIUM 40 MG/0.4ML ~~LOC~~ SOLN
40.0000 mg | SUBCUTANEOUS | Status: DC
  Filled 2017-06-21: qty 0.4

## 2017-06-21 NOTE — ED Provider Notes (Signed)
[facility] Emergency Department Provider Note  ____________________________________________  Time seen: Approximately 3:56 PM  I have reviewed the triage vital signs and the nursing notes.   HISTORY  Chief Complaint Abdominal Pain and Leg Pain   HPI Crystal Villegas is a 31 y.o. female with h/o seizure disorder and cocaine abusewho presents for evaluation of bilateral leg pain. Hasn't walked in the room to evaluate patient, patient refuses to speak with me and points at her husband. Her husband says the patient is here because she's been having leg pain. I asked the patient was she did not want to talk to me or answer my questions and she says "I don;t want to be here, I want to be transferred to [facility]". I asked patient to tell me about her pain and she said "Is there more than one more type of pain? Stop fucking asking me questions". I had previously seen that her UA was positive for UTI, so I asked her about urinary symptoms and she said "I have leg pain not urinary pain, are you deaf". At that point I asked if patient would like to received treatment and if so she would have to answer my questions and allow me to examine her otherwise I would be glad to discharge her if she would prefer to go to [facility] for treatment. Patient then asked me to discharge her because she didn't want to be here.  Past Medical History:  Diagnosis Date  . Asthma   . Hypertension   . Scoliosis   . Seizures [facility])     Patient Active Problem List   Diagnosis Date Noted  . Seizure (HCC) 06/12/2017    Past Surgical History:  Procedure Laterality Date  . BACK SURGERY    . TUBAL LIGATION      Prior to Admission medications   Medication Sig Start Date End Date Taking? Authorizing Provider  ADVAIR DISKUS 250-50 MCG/DOSE AEPB Inhale 1 puff into the lungs 2 (two) times daily.    [provider]  albuterol (PROVENTIL) (2.5 MG/3ML) 0.083% nebulizer solution Inhale 3 mLs into the  lungs every 6 (six) hours as needed.    [provider]  amoxicillin-clavulanate (AUGMENTIN) 875-125 MG tablet Take 1 tablet by mouth every 12 (twelve) hours. 06/13/17   Shaune Pollack, MD  aspirin EC 81 MG tablet Take 81 mg by mouth daily.    [provider]  diazepam (VALIUM) 5 MG tablet Take 1 tablet (5 mg total) by mouth every 8 (eight) hours as needed for muscle spasms. 07/05/15   Emily Filbert, MD  docusate sodium (COLACE) 100 MG capsule Take 100 mg by mouth 2 (two) times daily.    [provider]  ibuprofen (ADVIL,MOTRIN) 800 MG tablet Take 1 tablet (800 mg total) by mouth every 8 (eight) hours as needed. 04/23/16   Rockne Menghini, MD  lamoTRIgine (LAMICTAL) 25 MG tablet Take 1 tablet (25 mg total) by mouth 2 (two) times daily. 06/13/17   Shaune Pollack, MD  Multiple Vitamins-Calcium (ONE-A-DAY WOMENS FORMULA) TABS Take 1 tablet by mouth daily.    [provider]  nitrofurantoin, macrocrystal-monohydrate, (MACROBID) 100 MG capsule Take 1 capsule (100 mg total) by mouth 2 (two) times daily. 06/21/17 06/26/17  Nita Sickle, MD    Allergies Patient has no known allergies.  No family history on file.  Social History Social History  Substance Use Topics  . Smoking status: Current Some Day Smoker  . Smokeless tobacco: Current User  .  Alcohol use Yes    Review of Systems  Musculoskeletal: + b/l leg pain.  Unable to obtain due to patient's refusal to answer questions ____________________________________________   PHYSICAL EXAM:  VITAL SIGNS: ED Triage Vitals [06/21/17 1416]  Enc Vitals Group     BP 120/76     Pulse Rate 85     Resp 18     Temp 98.2 F (36.8 C)     Temp Source Oral     SpO2 100 %     Weight      Height      Head Circumference      Peak Flow      Pain Score      Pain Loc      Pain Edu?      Excl. in GC?    Unable to be performed as patient refused to allow an examination  General: No distress, ambulating with  no difficulty ____________________________________________   LABS (all labs ordered are listed, but only abnormal results are displayed)  Labs Reviewed  COMPREHENSIVE METABOLIC PANEL - Abnormal; Notable for the following:       Result Value   Total Protein 8.7 (*)    All other components within normal limits  CBC WITH DIFFERENTIAL/PLATELET - Abnormal; Notable for the following:    Hemoglobin 10.7 (*)    HCT 32.9 (*)    RDW 18.7 (*)    Platelets 485 (*)    All other components within normal limits  URINALYSIS, COMPLETE (UACMP) WITH MICROSCOPIC - Abnormal; Notable for the following:    Color, Urine YELLOW (*)    APPearance CLOUDY (*)    Leukocytes, UA MODERATE (*)    Bacteria, UA RARE (*)    Squamous Epithelial / LPF 6-30 (*)    All other components within normal limits  PREGNANCY, URINE   ____________________________________________  EKG  none  ____________________________________________  RADIOLOGY  none  ____________________________________________   PROCEDURES  Procedure(s) performed: None Procedures Critical Care performed:  None ____________________________________________   INITIAL IMPRESSION / ASSESSMENT AND PLAN / ED COURSE  31 y.o. female with h/o seizure disorder and cocaine abusewho presents for evaluation of bilateral leg pain. patient refused to answer any questions or allow me to examine her. Patient [facility]. I tried to talk to patient and her husband to understand why patient was so upset but she refused to speak with me. Patient using vulgar language and cursing at staff and myself. Patient was discharged AGAINST MEDICAL ADVICE. I reviewed the patient blood work which shows no acute findings other than a UTI. She was prescribed Macrobid.     Pertinent labs & imaging results that were available during my care of the patient were reviewed by me and considered in my medical decision making (see chart  for details).    ____________________________________________   FINAL CLINICAL IMPRESSION(S) / ED DIAGNOSES  Final diagnoses:  Pain in both lower extremities  Acute cystitis with hematuria      NEW MEDICATIONS STARTED DURING THIS VISIT:  New Prescriptions   NITROFURANTOIN, MACROCRYSTAL-MONOHYDRATE, (MACROBID) 100 MG CAPSULE    Take 1 capsule (100 mg total) by mouth 2 (two) times daily.     Note:  This document was prepared using Dragon voice recognition software and may include unintentional dictation errors.    Don PerkingVeronese, [place], MD 06/21/17 [phone]

## 2017-06-21 NOTE — Discharge Instructions (Signed)

## 2017-06-21 NOTE — ED Provider Notes (Signed)
[facility] Emergency Department Provider Note ____________________________________________   I have reviewed the triage vital signs and the triage nursing note.  HISTORY  Chief Complaint Aphasia and Cerebrovascular Accident   Historian Patient And her husband  HPI Crystal Villegas is a 31 y.o. female with a history of seizures since she was a child, states that she takes Lamictal for that although there is some question about compliance, presents to the ER after having left the ER without evaluation because she was refusing to speak with the prior physician and was apparently belligerent with staff, and then signed back in at the urging of her family apparently.  Patient is able to provide her own history and she is having slurred speech and some slow and difficulty in getting her words out. Patient states that she was admitted to the hospital recently for being found altered and was admitted with workup and was under the impression that she had suffered a stroke.  I was able to review the results from the MRI and update both the patient and the family that the MRI was indeed normal. Patient's EKG was also normal.  patient states that since she was in the hospital she was having trouble forming her words and having some slurred speech as well as leg weakness. She states that since being discharged home she's had persistent and worsening leg weakness to the point where she is now using a walker that was given to her by a family member to walk.  States that she feels like her face muscles are weak. Denies vision changes. Denies fever. Denies tick bite. Denies skin rash or arthritis.  Reports no history of underlying psychiatric illness, but states that she has been under significant stress due to grief from multiple deaths in the family over the past several months.  No reported recalled illnesses in terms of upper respiratory or GI in the past several months.    Past  Medical History:  Diagnosis Date  . Asthma   . Hypertension   . Scoliosis   . Seizures [facility](HCC)     Patient Active Problem List   Diagnosis Date Noted  . Seizure (HCC) 06/12/2017    Past Surgical History:  Procedure Laterality Date  . BACK SURGERY    . TUBAL LIGATION      Prior to Admission medications   Medication Sig Start Date End Date Taking? Authorizing Provider  amoxicillin-clavulanate (AUGMENTIN) 875-125 MG tablet Take 1 tablet by mouth every 12 (twelve) hours. 06/13/17  Yes Shaune Pollackhen, Qing, MD  aspirin EC 81 MG tablet Take 81 mg by mouth daily.   Yes [provider]  docusate sodium (COLACE) 100 MG capsule Take 100 mg by mouth 2 (two) times daily.   Yes [provider]  ibuprofen (ADVIL,MOTRIN) 800 MG tablet Take 1 tablet (800 mg total) by mouth every 8 (eight) hours as needed. 04/23/16  Yes Rockne MenghiniNorman, Anne-Caroline, MD  lamoTRIgine (LAMICTAL) 25 MG tablet Take 1 tablet (25 mg total) by mouth 2 (two) times daily. Patient taking differently: Take 25 mg by mouth every morning.  06/13/17  Yes Shaune Pollackhen, Qing, MD  lamoTRIgine (LAMICTAL) 25 MG tablet Take 50 mg by mouth at bedtime.   Yes [provider]  Multiple Vitamins-Calcium (ONE-A-DAY WOMENS FORMULA) TABS Take 1 tablet by mouth daily.   Yes [provider]  ADVAIR DISKUS 250-50 MCG/DOSE AEPB Inhale 1 puff into the lungs 2 (two) times daily.    [provider]  albuterol (PROVENTIL) (2.5  MG/3ML) 0.083% nebulizer solution Inhale 3 mLs into the lungs every 6 (six) hours as needed.    [provider]  diazepam (VALIUM) 5 MG tablet Take 1 tablet (5 mg total) by mouth every 8 (eight) hours as needed for muscle spasms. Patient not taking: Reported on 06/21/2017 07/05/15   Emily Filbert, MD  nitrofurantoin, macrocrystal-monohydrate, (MACROBID) 100 MG capsule Take 1 capsule (100 mg total) by mouth 2 (two) times daily. Patient not taking: Reported on 06/21/2017 06/21/17 06/26/17  Nita Sickle,  MD    No Known Allergies  No family history on file.  Social History Social History  Substance Use Topics  . Smoking status: Current Some Day Smoker  . Smokeless tobacco: Current User  . Alcohol use Yes    Review of Systems  Constitutional: Negative for fever. Eyes: Negative for visual changes. ENT: Negative for sore throat. Cardiovascular: Negative for chest pain. Respiratory: Negative for shortness of breath. Gastrointestinal: Negative for abdominal pain, vomiting and diarrhea. Genitourinary: Negative for dysuria. Musculoskeletal: Negative for back pain. Skin: Negative for rash. Neurological: Negative for headache.  ____________________________________________   PHYSICAL EXAM:  VITAL SIGNS: ED Triage Vitals  Enc Vitals Group     BP 06/21/17 1735 130/66     Pulse Rate 06/21/17 1735 (!) 106     Resp 06/21/17 1735 18     Temp 06/21/17 1735 98.4 F (36.9 C)     Temp src --      SpO2 06/21/17 1735 100 %     Weight --      Height --      Head Circumference --      Peak Flow --      Pain Score 06/21/17 1737 10     Pain Loc --      Pain Edu? --      Excl. in GC? --      Constitutional: Alert and cooperative. Well appearing and in no distress. HEENT   Head: Normocephalic and atraumatic.      Eyes: Conjunctivae are normal. Pupils equal and round.       Ears:         Nose: No congestion/rhinnorhea.   Mouth/Throat: Mucous membranes are moist.  no pharyngeal erythema.   Neck: No stridor. Cardiovascular/Chest: Normal rate, regular rhythm.  No murmurs, rubs, or gallops. Respiratory: Normal respiratory effort without tachypnea nor retractions. Breath sounds are clear and equal bilaterally. No wheezes/rales/rhonchi. Gastrointestinal: Soft. No distention, no guarding, no rebound. Nontender. Genitourinary/rectal:Deferred Musculoskeletal: Nontender with normal range of motion in all extremities. No joint effusions.  No lower extremity tenderness.  No  edema. Neurologic:  no facial droop. Patient has somewhat slowed speech and somewhat slurred speech as she is not opening her mouth very wide. She is able to open her mouth wide for oral exam but seems to be fairly slow to open her mouth.  4 out of 5 strength in bilateral upper and lower extremities all the lower extremities seem to be more weak. When she stands well decided that she standing on her toes. She is able to walk with a walker on her own. Reports possible paresthesia to the right face, but otherwise no sensory changes.  unable to elicit deep tendon reflexes in the Achilles, patella. Slight questionable decreased biceps reflex but is present bilaterally. Skin:  Skin is warm, dry and intact. No rash noted. Psychiatric: cooperative.  ____________________________________________  LABS (pertinent positives/negatives)  Labs Reviewed  URINE DRUG SCREEN, QUALITATIVE ([facility] ONLY) - Abnormal; Notable  for the following:       Result Value   Benzodiazepine, Ur Scrn POSITIVE (*)    All other components within normal limits  LAMOTRIGINE LEVEL    ____________________________________________    EKG I, Governor Rooks, MD, the attending physician have personally viewed and interpreted all ECGs.  none ____________________________________________  RADIOLOGY All Xrays were viewed by me. Imaging interpreted by Radiologist.  none __________________________________________  PROCEDURES  Procedure(s) performed: None  Critical Care performed: None  ____________________________________________   ED COURSE / ASSESSMENT AND PLAN  Pertinent labs & imaging results that were available during my care of the patient were reviewed by me and considered in my medical decision making (see chart for details).   I was able to consult with Dr. Don Perking and the nurse taking care of the patient earlier today and it sounds like their presentation to me is quite different from interaction perspective.  This is a little concerning in terms of the patient's extreme change in mood because for me she seems quite calm and interactive.  In any case, I reviewed her neurologic evaluation from the prior visit, and I think she probably needs more in-depth neurologic evaluation before ruling in psychiatric possible cause of her symptoms which is also potentially likely given the patient's understanding and admission of severe stressors with multiple family losses recently.  She does have decreased DTR in her patella, unable to elicit, making this more concerning to me for possible delayed barr. I discussed with hospitalist for admission.  Patient has "rods "in her back, we'll not be able to get spinal fluid in the ER. This can be evaluated in hospital.   patient denies to me history of drug abuse, or underlying psychiatric illness. Her urine drug screen today is negative for cocaine. She states that she thinks she must avoid drugs.     CONSULTATIONS:  hospitalist for admission, Dr. Letitia Libra.  Patient / Family / Caregiver informed of clinical course, medical decision-making process, and agree with plan.  ___________________________________________   FINAL CLINICAL IMPRESSION(S) / ED DIAGNOSES   Final diagnoses:  Leg weakness, bilateral  Slurred speech              Note: This dictation was prepared with Dragon dictation. Any transcriptional errors that result from this process are unintentional    Governor Rooks, MD 06/21/17 2002

## 2017-06-21 NOTE — H&P (Signed)
[facility] [place] at [facility]   PATIENT NAME: Crystal Villegas    MR#:  [id number]  DATE OF BIRTH:  [birth year]  DATE OF ADMISSION:  06/21/2017  PRIMARY CARE PHYSICIAN: Thornton PapasSykes, Larry Justain, PA-C   REQUESTING/REFERRING PHYSICIAN: Shaune PollackLord, MD  CHIEF COMPLAINT:   Chief Complaint  Patient presents with  . Aphasia  . Cerebrovascular Accident    HISTORY OF PRESENT ILLNESS:  Crystal Villegas  is a 31 y.o. female who presents with about a week of progressive lower extremity weakness and slurred speech. Patient was seen and evaluated here with MRI of the brain which was normal several days ago.  He states that her weakness has gotten progressively worse, and she has had to use a walker now just to get around. Her slurred speech has also not improved. Patient was originally in the ED earlier today, became somewhat belligerent and left. Per chart review her family convinced her to come back, but on this writer's exam she is minimally cooperative, answering mostly only yes or no, and only minimally cooperative on exam.  PAST MEDICAL HISTORY:   Past Medical History:  Diagnosis Date  . Asthma   . Hypertension   . Scoliosis   . Seizures (HCC)     PAST SURGICAL HISTORY:   Past Surgical History:  Procedure Laterality Date  . BACK SURGERY    . TUBAL LIGATION      SOCIAL HISTORY:   Social History  Substance Use Topics  . Smoking status: Current Some Day Smoker  . Smokeless tobacco: Current User  . Alcohol use Yes    FAMILY HISTORY:   Family History  Problem Relation Age of Onset  . Family history unknown: Yes    DRUG ALLERGIES:  No Known Allergies  MEDICATIONS AT HOME:   Prior to Admission medications   Medication Sig Start Date End Date Taking? Authorizing Provider  amoxicillin-clavulanate (AUGMENTIN) 875-125 MG tablet Take 1 tablet by mouth every 12 (twelve) hours. 06/13/17  Yes Shaune Pollackhen, Qing, MD  aspirin EC 81 MG tablet Take 81 mg by mouth daily.   Yes  [provider]  docusate sodium (COLACE) 100 MG capsule Take 100 mg by mouth 2 (two) times daily.   Yes [provider]  ibuprofen (ADVIL,MOTRIN) 800 MG tablet Take 1 tablet (800 mg total) by mouth every 8 (eight) hours as needed. 04/23/16  Yes Rockne MenghiniNorman, Anne-Caroline, MD  lamoTRIgine (LAMICTAL) 25 MG tablet Take 1 tablet (25 mg total) by mouth 2 (two) times daily. Patient taking differently: Take 25 mg by mouth every morning.  06/13/17  Yes Shaune Pollackhen, Qing, MD  lamoTRIgine (LAMICTAL) 25 MG tablet Take 50 mg by mouth at bedtime.   Yes [provider]  Multiple Vitamins-Calcium (ONE-A-DAY WOMENS FORMULA) TABS Take 1 tablet by mouth daily.   Yes [provider]  ADVAIR DISKUS 250-50 MCG/DOSE AEPB Inhale 1 puff into the lungs 2 (two) times daily.    [provider]  albuterol (PROVENTIL) (2.5 MG/3ML) 0.083% nebulizer solution Inhale 3 mLs into the lungs every 6 (six) hours as needed.    [provider]    REVIEW OF SYSTEMS:  Review of Systems  Constitutional: Negative for chills, fever, malaise/fatigue and weight loss.  HENT: Negative for ear pain, hearing loss and tinnitus.   Eyes: Negative for blurred vision, double vision, pain and redness.  Respiratory: Negative for cough, hemoptysis and shortness of breath.   Cardiovascular: Negative for chest pain, palpitations, orthopnea and leg swelling.  Gastrointestinal:  Negative for abdominal pain, constipation, diarrhea, nausea and vomiting.  Genitourinary: Negative for dysuria, frequency and hematuria.  Musculoskeletal: Positive for falls. Negative for back pain, joint pain and neck pain.  Skin:       No acne, rash, or lesions  Neurological: Positive for speech change and focal weakness. Negative for dizziness, tremors and weakness.  Endo/Heme/Allergies: Negative for polydipsia. Does not bruise/bleed easily.  Psychiatric/Behavioral: Negative for depression. The patient is not nervous/anxious and does  not have insomnia.      VITAL SIGNS:   Vitals:   06/21/17 1735 06/21/17 1922  BP: 130/66 126/87  Pulse: (!) 106 89  Resp: 18 16  Temp: 98.4 F (36.9 C)   SpO2: 100% 99%   Wt Readings from Last 3 Encounters:  06/12/17 49 kg (108 lb 0.4 oz)  04/23/16 54.4 kg (120 lb)  07/05/15 51 kg (112 lb 7 oz)    PHYSICAL EXAMINATION:  Physical Exam  Vitals reviewed. Constitutional: She is oriented to person, place, and time. She appears well-developed and well-nourished. No distress.  HENT:  Head: Normocephalic and atraumatic.  Mouth/Throat: Oropharynx is clear and moist.  Eyes: Pupils are equal, round, and reactive to light. Conjunctivae and EOM are normal. No scleral icterus.  Neck: Normal range of motion. Neck supple. No JVD present. No thyromegaly present.  Cardiovascular: Normal rate, regular rhythm and intact distal pulses.  Exam reveals no gallop and no friction rub.   No murmur heard. Respiratory: Effort normal and breath sounds normal. No respiratory distress. She has no wheezes. She has no rales.  GI: Soft. Bowel sounds are normal. She exhibits no distension. There is no tenderness.  Musculoskeletal: Normal range of motion. She exhibits no edema.  No arthritis, no gout  Lymphadenopathy:    She has no cervical adenopathy.  Neurological: She is alert and oriented to person, place, and time. No cranial nerve deficit.  Neurologic: Cranial nerves II-XII intact, Sensation intact to light touch/pinprick, 5/5 strength in bilateral upper extremities, possibly 4/5 strength in bilateral lower extremities, the patient did not cooperate significantly with this exam, positive for dysarthria, no aphasia, no dysphagia, memory intact, no pronator drift  Skin: Skin is warm and dry. No rash noted. No erythema.  Psychiatric:  Appears somewhat irritated    LABORATORY PANEL:   CBC  Recent Labs Lab 06/21/17 1420  WBC 4.1  HGB 10.7*  HCT 32.9*  PLT 485*    ------------------------------------------------------------------------------------------------------------------  Chemistries   Recent Labs Lab 06/21/17 1420  NA 135  K 4.2  CL 102  CO2 27  GLUCOSE 89  BUN 15  CREATININE 0.80  CALCIUM 9.8  AST 22  ALT 19  ALKPHOS 53  BILITOT 0.4   ------------------------------------------------------------------------------------------------------------------  Cardiac Enzymes No results for input(s): TROPONINI in the last 168 hours. ------------------------------------------------------------------------------------------------------------------  RADIOLOGY:  No results found.  EKG:   Orders placed or performed during the hospital encounter of 06/12/17  . EKG 12-Lead  . EKG 12-Lead    IMPRESSION AND PLAN:  Principal Problem:   Muscle weakness - unclear etiology. She had an MRI within the last week which was completely normal.she has bilateral lower extremity weakness, difficult to assess on exam due to the patient being minimally cooperative. We will start with blood work to check her thyroid function, and to initially screen possible rheumatologic etiologies or something like polymyositis.neurology consult Active Problems:   Seizure disorder (HCC) - continue home dose antiepileptics   HTN (hypertension) - continue home meds   Asthma -  home dose inhalers  All the records are reviewed and case discussed with ED provider. Management plans discussed with the patient and/or family.  DVT PROPHYLAXIS: SubQ lovenox  GI PROPHYLAXIS: None  ADMISSION STATUS: Inpatient  CODE STATUS: Full Code Status History    Date Active Date Inactive Code Status Order ID Comments User Context   06/12/2017  3:40 PM 06/13/2017  7:47 PM Full Code [id number]  Ramonita Lab, MD ED      TOTAL TIME TAKING CARE OF THIS PATIENT: 45 minutes.   Clee Pandit FIELDING 06/21/2017, 8:55 PM  [facility]  Office  [phone]  CC: Primary  care physician; Thornton Papas, PA-C  Note:  This document was prepared using Dragon voice recognition software and may include unintentional dictation errors.

## 2017-06-21 NOTE — ED Notes (Signed)
Prior to patient checking back, pt's family spoke with Luann with patient relations about patient leaving AMA earlier today.  Pt and family agreed to check into the ED again.  Patient was polite and cooperative during registration process at the front desk.  Prior to patient checking back in, patient was observed by this writer sitting on a bench outside of the emergency department door in no acute distress when observed.   Family or patient did not have any needs that were expressed after patient was discharged until the cousin of the patient approached the first nurse regarding wanting to speak with patient relations.

## 2017-06-21 NOTE — ED Notes (Signed)
Patient refusing to communicate with MD, stating that she wants to leave and go to [facility].

## 2017-06-21 NOTE — ED Triage Notes (Signed)
Patient presents to the ED with worsening speech issues, depression, and difficulty caring for herself at home.  Patient states she had a stroke on August 8th that has left her with difficulty walking and speaking but patient reports acute new speech changes that began today around noon.  Patient denies worsening weakness.  Patient is complaining of soreness of her abdominal muscles that she reports is caused from straining with her new walker and pain in her jaw from her difficulty speaking.  Patient states she does not feel she can care for herself and her three children at home.  Patient reports that she was not sent home with rehab plan or home health.  Patient states her grandfather died on the same day she had a stroke and she was not able to go to his funeral and she is now feeling depressed.  Patient states she gets frustrated answering questions and having to repeat herself due to her stroke.  Patient denies thoughts of hurting herself.

## 2017-06-21 NOTE — ED Notes (Addendum)
Unable to assess patient, patient refusing to cooperate

## 2017-06-21 NOTE — ED Triage Notes (Signed)
Pt came to ED c/o leg and waist pain. Admitted last week for stroke. Speech changes from stroke last week, no new changes.

## 2017-06-21 NOTE — ED Notes (Signed)
Patient refuses to acknowledge RN, engage in discharge conversation, or sign discharge instructions/ama form. Patient is verbally abusive, argumenative, and degrading to staff.   Patient refused to accept discharge instructions and prescriptions stating "i dont need to fucking deal with you people, fuck you, ill fucking go to a real hospital without you white motherfuckers"  Patient left the emergency department under her own ambulatory power without difficulty, at baseline.

## 2017-06-21 NOTE — ED Triage Notes (Addendum)
First nurse note.  Pt to ed with c/o waist and muscle pain since having stroke on August 30th.

## 2017-06-22 ENCOUNTER — Inpatient Hospital Stay: Payer: Medicaid Other

## 2017-06-22 DIAGNOSIS — M6281 Muscle weakness (generalized): Secondary | ICD-10-CM

## 2017-06-22 DIAGNOSIS — R4781 Slurred speech: Secondary | ICD-10-CM

## 2017-06-22 DIAGNOSIS — F329 Major depressive disorder, single episode, unspecified: Secondary | ICD-10-CM

## 2017-06-22 LAB — BASIC METABOLIC PANEL
Anion gap: 6 (ref 5–15)
BUN: 14 mg/dL (ref 6–20)
CO2: 27 mmol/L (ref 22–32)
CREATININE: 0.8 mg/dL (ref 0.44–1.00)
Calcium: 9.4 mg/dL (ref 8.9–10.3)
Chloride: 103 mmol/L (ref 101–111)
GFR calc non Af Amer: >60 mL/min (ref >60)
Glucose, Bld: 93 mg/dL (ref 65–99)
Potassium: 3.9 mmol/L (ref 3.5–5.1)
SODIUM: 136 mmol/L (ref 135–145)

## 2017-06-22 LAB — CBC
HCT: 31.1 % — ABNORMAL LOW (ref 35.0–47.0)
Hemoglobin: 9.7 g/dL — ABNORMAL LOW (ref 12.0–16.0)
MCH: 25.3 pg — AB (ref 26.0–34.0)
MCHC: 31.2 g/dL — ABNORMAL LOW (ref 32.0–36.0)
MCV: 81.1 fL (ref 80.0–100.0)
PLATELETS: 444 10*3/uL — AB (ref 150–440)
RBC: 3.83 MIL/uL (ref 3.80–5.20)
RDW: 18.4 % — AB (ref 11.5–14.5)
WBC: 5.5 10*3/uL (ref 3.6–11.0)

## 2017-06-22 MED ORDER — MIRTAZAPINE 15 MG PO TABS
15.0000 mg | ORAL_TABLET | Freq: Every day | ORAL | Status: DC
  Administered 2017-06-22 – 2017-06-23 (×2): 15 mg via ORAL
  Filled 2017-06-22 (×2): qty 1

## 2017-06-22 NOTE — Consult Note (Signed)
[facility] Face-to-Face Psychiatry Consult   Reason for Consult:  Depression Referring Physician:  Dr. Darvin Neighbours Patient Identification: Crystal Villegas MRN:  [id number] Principal Diagnosis: Muscle weakness Diagnosis:   Patient Active Problem List   Diagnosis Date Noted  . HTN (hypertension) [I10] 06/21/2017  . Asthma [J45.909] 06/21/2017  . Muscle weakness [M62.81] 06/21/2017  . Seizure ([place]) [R56.9] 06/12/2017    Total Time spent with patient: 1 hour  Subjective:   Identifying data. Crystal Villegas is a 31 year old female with no past psychiatric history.  Chief complaint. "I was just trying to go to sleep."  History of present illness. Information was obtained from the patient and the chart. The patient came to emergency room complaining of weakness in her legs and waist and slurred speech since her last hospital admission at the end of August. The patient believes that she suffered a stroke. This is actually not true. Her MRI did not reveal any abnormalities. The patient complains that her pain, difficulties walking, and slurred speech has been progressively worse to the point that she is now using a walker offered family member and has not been able to attend to ADLs or take care of her 3 small children. During the admission process the patient was rather difficult. She refused to talk to doctors and nurses, insisted to be transferred to [facility], left the emergency room, referred to the emergency room and allow him to be admitted. The patient reportedly experienced multiple losses in the past few months including the death of her uncle at the time when she was hospitalized. She reported to her primary doctor that she feels depressed. A psychiatrist was called to evaluate the patient.   During the interview, the patient initially refused, then agreed. She initially wanted her husband to stay in the room but eventually asked him to leave. She was not ready to share much in patient repeatedly that she is "not  ready to talk about it" or "doesn't like to talk about it". She confirmed that she experienced multiple losses recently and has not been able to deal with it. Also decline in her physical health is a great source of concern. Has small children ages 76, 31, and 73 are in the care of her parents and her cousin. The patient hardly look at me during the interview. She was visibly upset and close to tears with my questions but did her best to answer what she could. She let me know that arguing her lunch. The husband brought her a sandwich but she informed me that she no longer feels hungry. She also told me that I ruined her nap that she was about to take.  She reports some symptoms of depression with sadness, poor energy and concentration, crying spells but denies sleep or appetite problems, or weight loss. She adamantly denies having any thoughts, intention, or plans to hurt herself or others. She denies psychotic symptoms or symptoms suggestive of bipolar mania. She reports infrequent panic attacks but no other form of anxiety. She was positive for cocaine during her hospitalization in August. She is positive for benzodiazepines during current admission. She denies any substance use.  Of note, her speech was extremely slurred and incomprehensible when the husband was in the room initially. I would understand her much better when we were alone. The patient is not interested in psychotherapy as she finds it very difficult to talk about her losses and her feelings. She did agree to try an antidepressant. She does not have any preferences.  Past psychiatric history. She never suffered depression, was treated, hospitalized, or attempted suicide.  Family psychiatric history. Denies any.  Social history. She has been married for 12 years, if I understood correctly. She has 3 small children and lives with her husband. The husband was in the room initially. The patient has a history of seizures since childhood.  Apparently there is a history of noncompliance with medications.   Risk to Self: Is patient at risk for suicide?: No Risk to Others:   Prior Inpatient Therapy:   Prior Outpatient Therapy:    Past Medical History:  Past Medical History:  Diagnosis Date  . Asthma   . Hypertension   . Scoliosis   . Seizures ([place])     Past Surgical History:  Procedure Laterality Date  . BACK SURGERY    . TUBAL LIGATION     Family History:  Family History  Problem Relation Age of Onset  . Family history unknown: Yes   Social History:  History  Alcohol Use No     History  Drug Use No    Social History   Social History  . Marital status: Single    Spouse name: N/A  . Number of children: N/A  . Years of education: N/A   Social History Main Topics  . Smoking status: Current Some Day Smoker  . Smokeless tobacco: Current User  . Alcohol use No  . Drug use: No  . Sexual activity: Yes   Other Topics Concern  . None   Social History Narrative  . None   Additional Social History:    Allergies:  No Known Allergies  Labs:  Results for orders placed or performed during the hospital encounter of 06/21/17 (from the past 48 hour(s))  TSH     Status: None   Collection Time: 06/21/17  2:24 PM  Result Value Ref Range   TSH 2.277 0.350 - 4.500 uIU/mL    Comment: Performed by a 3rd Generation assay with a functional sensitivity of <=0.01 uIU/mL.  T4, free     Status: None   Collection Time: 06/21/17  2:24 PM  Result Value Ref Range   Free T4 0.70 0.61 - 1.12 ng/dL    Comment: (NOTE) Biotin ingestion may interfere with free T4 tests. If the results are inconsistent with the TSH level, previous test results, or the clinical presentation, then consider biotin interference. If needed, order repeat testing after stopping biotin.   CK     Status: None   Collection Time: 06/21/17  2:24 PM  Result Value Ref Range   Total CK 132 38 - 234 U/L  Urine Drug Screen, Qualitative ([facility] only)      Status: Abnormal   Collection Time: 06/21/17  2:27 PM  Result Value Ref Range   Tricyclic, Ur Screen NONE DETECTED NONE DETECTED   Amphetamines, Ur Screen NONE DETECTED NONE DETECTED   MDMA (Ecstasy)Ur Screen NONE DETECTED NONE DETECTED   Cocaine Metabolite,Ur [place] NONE DETECTED NONE DETECTED   Opiate, Ur Screen NONE DETECTED NONE DETECTED   Phencyclidine (PCP) Ur S NONE DETECTED NONE DETECTED   Cannabinoid 50 Ng, Ur  NONE DETECTED NONE DETECTED   Barbiturates, Ur Screen NONE DETECTED NONE DETECTED   Benzodiazepine, Ur Scrn POSITIVE (A) NONE DETECTED   Methadone Scn, Ur NONE DETECTED NONE DETECTED    Comment: (NOTE) 115  Tricyclics, urine               Cutoff 1000 ng/mL 200  Amphetamines, urine  Cutoff 1000 ng/mL 300  MDMA (Ecstasy), urine           Cutoff 500 ng/mL 400  Cocaine Metabolite, urine       Cutoff 300 ng/mL 500  Opiate, urine                   Cutoff 300 ng/mL 600  Phencyclidine (PCP), urine      Cutoff 25 ng/mL 700  Cannabinoid, urine              Cutoff 50 ng/mL 800  Barbiturates, urine             Cutoff 200 ng/mL 900  Benzodiazepine, urine           Cutoff 200 ng/mL 1000 Methadone, urine                Cutoff 300 ng/mL 1100 1200 The urine drug screen provides only a preliminary, unconfirmed 1300 analytical test result and should not be used for non-medical 1400 purposes. Clinical consideration and professional judgment should 1500 be applied to any positive drug screen result due to possible 1600 interfering substances. A more specific alternate chemical method 1700 must be used in order to obtain a confirmed analytical result.  1800 Gas chromato graphy / mass spectrometry (GC/MS) is the preferred 1900 confirmatory method.   Sedimentation rate     Status: None   Collection Time: 06/21/17  9:15 PM  Result Value Ref Range   Sed Rate 11 0 - 20 mm/hr  Basic metabolic panel     Status: None   Collection Time: 06/22/17  5:02 AM  Result Value Ref Range    Sodium 136 135 - 145 mmol/L   Potassium 3.9 3.5 - 5.1 mmol/L   Chloride 103 101 - 111 mmol/L   CO2 27 22 - 32 mmol/L   Glucose, Bld 93 65 - 99 mg/dL   BUN 14 6 - 20 mg/dL   Creatinine, Ser 0.80 0.44 - 1.00 mg/dL   Calcium 9.4 8.9 - 10.3 mg/dL   GFR calc non Af Amer >60 >60 mL/min   GFR calc Af Amer >60 >60 mL/min    Comment: (NOTE) The eGFR has been calculated using the CKD EPI equation. This calculation has not been validated in all clinical situations. eGFR's persistently <60 mL/min signify possible Chronic Kidney Disease.    Anion gap 6 5 - 15  CBC     Status: Abnormal   Collection Time: 06/22/17  5:02 AM  Result Value Ref Range   WBC 5.5 3.6 - 11.0 K/uL   RBC 3.83 3.80 - 5.20 MIL/uL   Hemoglobin 9.7 (L) 12.0 - 16.0 g/dL   HCT 31.1 (L) 35.0 - 47.0 %   MCV 81.1 80.0 - 100.0 fL   MCH 25.3 (L) 26.0 - 34.0 pg   MCHC 31.2 (L) 32.0 - 36.0 g/dL   RDW 18.4 (H) 11.5 - 14.5 %   Platelets 444 (H) 150 - 440 K/uL    Current Facility-Administered Medications  Medication Dose Route Frequency Provider Last Rate Last Dose  . acetaminophen (TYLENOL) tablet 650 mg  650 mg Oral Q6H PRN Lance Coon, MD   650 mg at 06/21/17 2232   Or  . acetaminophen (TYLENOL) suppository 650 mg  650 mg Rectal Q6H PRN Lance Coon, MD      . albuterol (PROVENTIL) (2.5 MG/3ML) 0.083% nebulizer solution 3 mL  3 mL Inhalation Q6H PRN Lance Coon, MD      . aspirin  EC tablet 81 mg  81 mg Oral Daily Lance Coon, MD   81 mg at 06/22/17 0945  . lamoTRIgine (LAMICTAL) tablet 25 mg  25 mg Oral q morning - 10a Lance Coon, MD   25 mg at 06/22/17 0945  . lamoTRIgine (LAMICTAL) tablet 50 mg  50 mg Oral Corwin Levins, MD   50 mg at 06/21/17 2227  . mometasone-formoterol (DULERA) 200-5 MCG/ACT inhaler 2 puff  2 puff Inhalation BID Lance Coon, MD   2 puff at 06/22/17 0945  . ondansetron (ZOFRAN) tablet 4 mg  4 mg Oral Q6H PRN Lance Coon, MD       Or  . ondansetron [facility]) injection 4 mg  4 mg Intravenous  Q6H PRN Lance Coon, MD        Musculoskeletal: Strength & Muscle Tone: decreased Gait & Station: unsteady Patient leans: N/A  Psychiatric Specialty Exam: I reviewed physical examination performed by her in her knees and agree with her findings.  Physical Exam  Nursing note and vitals reviewed. Psychiatric: Thought content normal. Her affect is angry and inappropriate. Her speech is slurred. She is withdrawn. Cognition and memory are normal. She expresses impulsivity. She exhibits a depressed mood.    Review of Systems  Neurological: Positive for focal weakness, seizures and weakness.  Psychiatric/Behavioral: Positive for depression and substance abuse.  All other systems reviewed and are negative.   Blood pressure (!) 105/49, pulse 81, temperature 98.3 F (36.8 C), temperature source Oral, resp. rate 18, height 5' (1.524 m), weight 45.1 kg (99 lb 8 oz), last menstrual period 06/11/2017, SpO2 100 %.Body mass index is 19.43 kg/m.  General Appearance: Casual  Eye Contact:  Poor  Speech:  Slurred  Volume:  Decreased  Mood:  Depressed  Affect:  Blunt  Thought Process:  Goal Directed and Descriptions of Associations: Intact  Orientation:  Full (Time, Place, and Person)  Thought Content:  WDL  Suicidal Thoughts:  No  Homicidal Thoughts:  No  Memory:  Immediate;   Fair Recent;   Fair Remote;   Fair  Judgement:  Fair  Insight:  Fair  Psychomotor Activity:  Psychomotor Retardation  Concentration:  Concentration: Fair and Attention Span: Fair  Recall:  [employer] of Knowledge:  Fair  Language:  Fair  Akathisia:  No  Handed:  Right  AIMS (if indicated):     Assets:  Communication Skills Desire for Improvement Financial Resources/Insurance Housing Intimacy Resilience Social Support  ADL's:  Impaired  Cognition:  WNL  Sleep:        Treatment Plan Summary: Daily contact with patient to assess and evaluate symptoms and progress in treatment and Medication management    PLAN: The patient agreed to try antidepressant. I will start Remeron tonight.  Dr. Weber Cooks will follow up on Monday.  Disposition: No evidence of imminent risk to self or others at present.   Patient does not meet criteria for psychiatric inpatient admission. Supportive therapy provided about ongoing stressors. Discussed crisis plan, support from social network, calling 911, coming to the Emergency Department, and calling Suicide Hotline.  Orson Slick, MD 06/22/2017 3:17 PM

## 2017-06-22 NOTE — Consult Note (Signed)
Reason for Consult:Slurred speech, weakness Referring Physician: Sudini  CC: Slurred speech, weakness  HPI: Crystal Villegas is an 31 y.o. female with a history of seizures who was seen on 8/31 due to a syncopal episode that was felt to be secondary to a seizure related to noncompliance and illicit drug use.  Patient was not returning to baseline as quickly as in the past and therefore an MRI of the brain was performed that was unremarkable. At that time the patient had generalized weakness and delayed, slurred responses. She reports that her weakness has become progressively worse to the point that she is now using a walker. Speech remains slurred but now her teeth clench so she can not talk or eat well through them. No bowel or bladder incontinence.    Past Medical History:  Diagnosis Date  . Asthma   . Hypertension   . Scoliosis   . Seizures ([facility])     Past Surgical History:  Procedure Laterality Date  . BACK SURGERY    . TUBAL LIGATION      Family History  Problem Relation Age of Onset  . Family history unknown: Yes    Social History:  reports that she has been smoking.  She uses smokeless tobacco. She reports that she does not drink alcohol or use drugs.  No Known Allergies  Medications:  I have reviewed the patient's current medications. Prior to Admission:  Prescriptions Prior to Admission  Medication Sig Dispense Refill Last Dose  . amoxicillin-clavulanate (AUGMENTIN) 875-125 MG tablet Take 1 tablet by mouth every 12 (twelve) hours. 10 tablet 0 06/21/2017 at 0930  . aspirin EC 81 MG tablet Take 81 mg by mouth daily.   06/21/2017 at 0930  . docusate sodium (COLACE) 100 MG capsule Take 100 mg by mouth 2 (two) times daily.   06/21/2017 at 0930  . ibuprofen (ADVIL,MOTRIN) 800 MG tablet Take 1 tablet (800 mg total) by mouth every 8 (eight) hours as needed. 20 tablet 0 prn at prn  . lamoTRIgine (LAMICTAL) 25 MG tablet Take 1 tablet (25 mg total) by mouth 2 (two) times daily. (Patient  taking differently: Take 25 mg by mouth every morning. ) 60 tablet 1 06/21/2017 at Unknown time  . lamoTRIgine (LAMICTAL) 25 MG tablet Take 50 mg by mouth at bedtime.   06/20/2017 at Unknown time  . Multiple Vitamins-Calcium (ONE-A-DAY WOMENS FORMULA) TABS Take 1 tablet by mouth daily.   06/21/2017 at 0930  . ADVAIR DISKUS 250-50 MCG/DOSE AEPB Inhale 1 puff into the lungs 2 (two) times daily.   Not Taking at Unknown time  . albuterol (PROVENTIL) (2.5 MG/3ML) 0.083% nebulizer solution Inhale 3 mLs into the lungs every 6 (six) hours as needed.   Not Taking at Unknown time   Scheduled: . aspirin EC  81 mg Oral Daily  . lamoTRIgine  25 mg Oral q morning - 10a  . lamoTRIgine  50 mg Oral QHS  . mometasone-formoterol  2 puff Inhalation BID    ROS: History obtained from the patient  General ROS: negative for - chills, fatigue, fever, night sweats, weight gain or weight loss Psychological ROS: negative for - behavioral disorder, hallucinations, memory difficulties, mood swings or suicidal ideation Ophthalmic ROS: negative for - blurry vision, double vision, eye pain or loss of vision ENT ROS: difficulty swallowing Allergy and Immunology ROS: negative for - hives or itchy/watery eyes Hematological and Lymphatic ROS: negative for - bleeding problems, bruising or swollen lymph nodes Endocrine ROS: negative for - galactorrhea,  hair pattern changes, polydipsia/polyuria or temperature intolerance Respiratory ROS: negative for - cough, hemoptysis, shortness of breath or wheezing Cardiovascular ROS: negative for - chest pain, dyspnea on exertion, edema or irregular heartbeat Gastrointestinal ROS: negative for - abdominal pain, diarrhea, hematemesis, nausea/vomiting or stool incontinence Genito-Urinary ROS: negative for - dysuria, hematuria, incontinence or urinary frequency/urgency Musculoskeletal ROS: negative for - joint swelling or muscular weakness Neurological ROS: as noted in HPI Dermatological ROS:  negative for rash and skin lesion changes  Physical Examination: Blood pressure 100/67, pulse 73, temperature 97.7 F (36.5 C), temperature source Oral, resp. rate 20, height 5' (1.524 m), weight 45.1 kg (99 lb 8 oz), last menstrual period 06/11/2017, SpO2 100 %.  HEENT-  Normocephalic, no lesions, without obvious abnormality.  Normal external eye and conjunctiva.  Normal TM's bilaterally.  Normal auditory canals and external ears. Normal external nose, mucus membranes and septum.  Normal pharynx. Cardiovascular- S1, S2 normal, pulses palpable throughout   Lungs- chest clear, no wheezing, rales, normal symmetric air entry Abdomen- soft, non-tender; bowel sounds normal; no masses,  no organomegaly Extremities- no edema Lymph-no adenopathy palpable Musculoskeletal-no joint tenderness, deformity or swelling Skin-warm and dry, no hyperpigmentation, vitiligo, or suspicious lesions  Neurological Examination   Mental Status: Alert, oriented, thought content appropriate.  Speech fluent without evidence of aphasia.  Dysarthric when teeth clenched.  At times when teeth not clenched speech normal.  Able to follow 3 step commands without difficulty. Cranial Nerves: II: Discs flat bilaterally; Visual fields grossly normal, pupils equal, round, reactive to light and accommodation III,IV, VI: ptosis not present, extra-ocular motions intact bilaterally V,VII: smile symmetric, facial light touch sensation decreased on the right VIII: hearing normal bilaterally IX,X: gag reflex present XI: bilateral shoulder shrug XII: midline tongue extension Motor: BUE's at 4/5 bilaterally.  RLE 3+/5.  LLE 5-/5.  Normal tone. Tone and bulk:normal tone throughout; no atrophy noted Sensory: Pinprick and light touch intact throughout, bilaterally Deep Tendon Reflexes: 2+ in the upper extremities and absent in the lower extremities Plantars: Right: mute   Left: downgoing Cerebellar: Normal finger-to-nose and normal  heel-to-shin testing bilaterally Gait: not tested due to safety concerns    Laboratory Studies:   Basic Metabolic Panel:  Recent Labs Lab 06/21/17 1420 06/22/17 0502  NA 135 136  K 4.2 3.9  CL 102 103  CO2 27 27  GLUCOSE 89 93  BUN 15 14  CREATININE 0.80 0.80  CALCIUM 9.8 9.4    Liver Function Tests:  Recent Labs Lab 06/21/17 1420  AST 22  ALT 19  ALKPHOS 53  BILITOT 0.4  PROT 8.7*  ALBUMIN 4.7   No results for input(s): LIPASE, AMYLASE in the last 168 hours. No results for input(s): AMMONIA in the last 168 hours.  CBC:  Recent Labs Lab 06/21/17 1420 06/22/17 0502  WBC 4.1 5.5  NEUTROABS 2.0  --   HGB 10.7* 9.7*  HCT 32.9* 31.1*  MCV 81.9 81.1  PLT 485* 444*    Cardiac Enzymes:  Recent Labs Lab 06/21/17 1424  CKTOTAL 132    BNP: Invalid input(s): POCBNP  CBG: No results for input(s): GLUCAP in the last 168 hours.  Microbiology: No results found for this or any previous visit.  Coagulation Studies: No results for input(s): LABPROT, INR in the last 72 hours.  Urinalysis:  Recent Labs Lab 06/21/17 1427  COLORURINE YELLOW*  LABSPEC 1.029  PHURINE 5.0  GLUCOSEU NEGATIVE  HGBUR NEGATIVE  BILIRUBINUR NEGATIVE  KETONESUR NEGATIVE  PROTEINUR NEGATIVE  NITRITE NEGATIVE  LEUKOCYTESUR MODERATE*    Lipid Panel:  No results found for: CHOL, TRIG, HDL, CHOLHDL, VLDL, LDLCALC  HgbA1C: No results found for: HGBA1C  Urine Drug Screen:     Component Value Date/Time   LABOPIA NONE DETECTED 06/21/2017 1427   COCAINSCRNUR NONE DETECTED 06/21/2017 1427   LABBENZ POSITIVE (A) 06/21/2017 1427   AMPHETMU NONE DETECTED 06/21/2017 1427   THCU NONE DETECTED 06/21/2017 1427   LABBARB NONE DETECTED 06/21/2017 1427    Alcohol Level: No results for input(s): ETH in the last 168 hours.   Imaging: No results found.   Assessment/Plan: 31 year old female presenting with progressive weakness and slurred speech.  Due to the progressive nature, this  does suggest that this may not be an acute ischemic event .  Initial MRI of the brain unremarkable.  Would repeat MRI and if remains unremarkable  Would perform LP.  Diagnosis remains unclear.    Recommendations: 1.  PT/OT consults 2.  MRI of the brain without contrast 3.  If MR unremarkable would consider LP with protein, glucose, cell count, diff, IgG index, oligoclonal bands., myelin basis protein.  LP will need to be performed under fluoro 4.  RPR, B1, heavy metal screen, myasthenia panel  Thana Farr, MD Neurology [phone] 06/22/2017, 12:39 PM

## 2017-06-22 NOTE — Progress Notes (Signed)
[employer] [facility] - [place] at [facility]   PATIENT NAME: Crystal Villegas    MR#:  [id number]  DATE OF BIRTH:  November 23, 1985  SUBJECTIVE:  CHIEF COMPLAINT:   Chief Complaint  Patient presents with  . Aphasia  . Cerebrovascular Accident   Continues to have speech problems and weakness. Husband at bedside.  REVIEW OF SYSTEMS:    Review of Systems  Constitutional: Positive for malaise/fatigue. Negative for chills and fever.  HENT: Negative for sore throat.   Eyes: Negative for blurred vision, double vision and pain.  Respiratory: Negative for cough, hemoptysis, shortness of breath and wheezing.   Cardiovascular: Negative for chest pain, palpitations, orthopnea and leg swelling.  Gastrointestinal: Negative for abdominal pain, constipation, diarrhea, heartburn, nausea and vomiting.  Genitourinary: Negative for dysuria and hematuria.  Musculoskeletal: Negative for back pain and joint pain.  Skin: Negative for rash.  Neurological: Positive for speech change, focal weakness and weakness. Negative for sensory change and headaches.  Endo/Heme/Allergies: Does not bruise/bleed easily.  Psychiatric/Behavioral: Negative for depression. The patient is not nervous/anxious.     DRUG ALLERGIES:  No Known Allergies  VITALS:  Blood pressure 100/67, pulse 73, temperature 97.7 F (36.5 C), temperature source Oral, resp. rate 20, height 5' (1.524 m), weight 45.1 kg (99 lb 8 oz), last menstrual period 06/11/2017, SpO2 100 %.  PHYSICAL EXAMINATION:   Physical Exam  GENERAL:  31 y.o.-year-old patient lying in the bed with no acute distress.  EYES: Pupils equal, round, reactive to light and accommodation. No scleral icterus. Extraocular muscles intact.  HEENT: Head atraumatic, normocephalic. Oropharynx and nasopharynx clear.  NECK:  Supple, no jugular venous distention. No thyroid enlargement, no tenderness.  LUNGS: Normal breath sounds bilaterally, no wheezing, rales, rhonchi. No use of  accessory muscles of respiration.  CARDIOVASCULAR: S1, S2 normal. No murmurs, rubs, or gallops.  ABDOMEN: Soft, nontender, nondistended. Bowel sounds present. No organomegaly or mass.  EXTREMITIES: No cyanosis, clubbing or edema b/l.    NEUROLOGIC: Cranial nerves II through XII are intact.  Right and Left UE's at 4/5 RLE 4/5.  LLE 5-/5 PSYCHIATRIC: The patient is alert and oriented x 3.  SKIN: No obvious rash, lesion, or ulcer.   LABORATORY PANEL:   CBC  Recent Labs Lab 06/22/17 0502  WBC 5.5  HGB 9.7*  HCT 31.1*  PLT 444*   ------------------------------------------------------------------------------------------------------------------ Chemistries   Recent Labs Lab 06/21/17 1420 06/22/17 0502  NA 135 136  K 4.2 3.9  CL 102 103  CO2 27 27  GLUCOSE 89 93  BUN 15 14  CREATININE 0.80 0.80  CALCIUM 9.8 9.4  AST 22  --   ALT 19  --   ALKPHOS 53  --   BILITOT 0.4  --    ------------------------------------------------------------------------------------------------------------------  Cardiac Enzymes No results for input(s): TROPONINI in the last 168 hours. ------------------------------------------------------------------------------------------------------------------  RADIOLOGY:  No results found.   ASSESSMENT AND PLAN:   * dysarthria and muscle weakness. Recent MRI of the brain showed no acute stroke. No seizures at this time. Etiology unclear. Discussed with Dr. Thad Ranger. We will get lumbar puncture. Discussed with radiologist. Scheduled for tomorrow. Discontinued Lovenox.  * seizure disorder. Continue Lamictal.  * depression. We will request psychiatry consultation.  * hypertension. On home medications. Well-controlled.  * asthma. No acute exacerbation.  All the records are reviewed and case discussed with Care Management/Social Worker Management plans discussed with the patient, family and they are in agreement.  CODE STATUS: FULL CODE  DVT  Prophylaxis:  SCDs  TOTAL TIME TAKING CARE OF THIS PATIENT: 35 minutes.   POSSIBLE D/C IN 1-2 DAYS, DEPENDING ON CLINICAL CONDITION.  Milagros LollSudini, Bayyinah Dukeman R M.D on 06/22/2017 at 12:10 PM  Between 7am to 6pm - Pager - [phone]  After 6pm go to [email or web address] - password [email or web address] [facility]  [facility] [place] [facility]  Office  [phone]  CC: Primary care physician; Thornton PapasSykes, Larry Justain, PA-C  Note: This dictation was prepared with Dragon dictation along with smaller phrase technology. Any transcriptional errors that result from this process are unintentional.

## 2017-06-23 ENCOUNTER — Inpatient Hospital Stay: Payer: Medicaid Other

## 2017-06-23 LAB — ANTI-JO 1 ANTIBODY, IGG

## 2017-06-23 LAB — CSF CELL COUNT WITH DIFFERENTIAL
EOS CSF: 0 %
Lymphs, CSF: 75 %
MONOCYTE-MACROPHAGE-SPINAL FLUID: 25 %
RBC Count, CSF: 393 /mm3 — ABNORMAL HIGH (ref 0–3)
Segmented Neutrophils-CSF: 0 %
TUBE #: 3
WBC, CSF: 2 /mm3 (ref 0–5)

## 2017-06-23 LAB — PROTEIN, CSF: TOTAL PROTEIN, CSF: 33 mg/dL (ref 15–45)

## 2017-06-23 LAB — GRAM STAIN: GRAM STAIN: NONE SEEN

## 2017-06-23 LAB — LAMOTRIGINE LEVEL: LAMOTRIGINE LVL: 3.1 ug/mL (ref 2.0–20.0)

## 2017-06-23 LAB — GLUCOSE, CSF: Glucose, CSF: 59 mg/dL (ref 40–70)

## 2017-06-23 LAB — ANA W/REFLEX IF POSITIVE: ANA: NEGATIVE

## 2017-06-23 MED ORDER — LIDOCAINE HCL (PF) 1 % IJ SOLN
5.0000 mL | Freq: Once | INTRAMUSCULAR | Status: AC
  Administered 2017-06-23: 09:00:00 5 mL

## 2017-06-23 MED ORDER — GADOBENATE DIMEGLUMINE 529 MG/ML IV SOLN
10.0000 mL | Freq: Once | INTRAVENOUS | Status: AC | PRN
  Administered 2017-06-23: 9 mL via INTRAVENOUS

## 2017-06-23 NOTE — Consult Note (Signed)
Reason for Consult:Slurred speech, weakness Referring Physician: Sudini    Pt's speech clearly improved and she is speaking in full sentences. Appears alert and able to tell me location/date/time. No dysarthria noted.   Past Medical History:  Diagnosis Date  . Asthma   . Hypertension   . Scoliosis   . Seizures (HCC)     Past Surgical History:  Procedure Laterality Date  . BACK SURGERY    . TUBAL LIGATION      Family History  Problem Relation Age of Onset  . Family history unknown: Yes    Social History:  reports that she has been smoking.  She uses smokeless tobacco. She reports that she does not drink alcohol or use drugs.  No Known Allergies  Medications:  I have reviewed the patient's current medications. Prior to Admission:  Prescriptions Prior to Admission  Medication Sig Dispense Refill Last Dose  . amoxicillin-clavulanate (AUGMENTIN) 875-125 MG tablet Take 1 tablet by mouth every 12 (twelve) hours. 10 tablet 0 06/21/2017 at 0930  . aspirin EC 81 MG tablet Take 81 mg by mouth daily.   06/21/2017 at 0930  . docusate sodium (COLACE) 100 MG capsule Take 100 mg by mouth 2 (two) times daily.   06/21/2017 at 0930  . ibuprofen (ADVIL,MOTRIN) 800 MG tablet Take 1 tablet (800 mg total) by mouth every 8 (eight) hours as needed. 20 tablet 0 prn at prn  . lamoTRIgine (LAMICTAL) 25 MG tablet Take 1 tablet (25 mg total) by mouth 2 (two) times daily. (Patient taking differently: Take 25 mg by mouth every morning. ) 60 tablet 1 06/21/2017 at Unknown time  . lamoTRIgine (LAMICTAL) 25 MG tablet Take 50 mg by mouth at bedtime.   06/20/2017 at Unknown time  . Multiple Vitamins-Calcium (ONE-A-DAY WOMENS FORMULA) TABS Take 1 tablet by mouth daily.   06/21/2017 at 0930  . ADVAIR DISKUS 250-50 MCG/DOSE AEPB Inhale 1 puff into the lungs 2 (two) times daily.   Not Taking at Unknown time  . albuterol (PROVENTIL) (2.5 MG/3ML) 0.083% nebulizer solution Inhale 3 mLs into the lungs every 6 (six) hours as  needed.   Not Taking at Unknown time   Scheduled: . aspirin EC  81 mg Oral Daily  . lamoTRIgine  25 mg Oral q morning - 10a  . lamoTRIgine  50 mg Oral QHS  . mirtazapine  15 mg Oral QHS  . mometasone-formoterol  2 puff Inhalation BID    ROS: History obtained from the patient  General ROS: negative for - chills, fatigue, fever, night sweats, weight gain or weight loss Psychological ROS: negative for - behavioral disorder, hallucinations, memory difficulties, mood swings or suicidal ideation Ophthalmic ROS: negative for - blurry vision, double vision, eye pain or loss of vision ENT ROS: difficulty swallowing Allergy and Immunology ROS: negative for - hives or itchy/watery eyes Hematological and Lymphatic ROS: negative for - bleeding problems, bruising or swollen lymph nodes Endocrine ROS: negative for - galactorrhea, hair pattern changes, polydipsia/polyuria or temperature intolerance Respiratory ROS: negative for - cough, hemoptysis, shortness of breath or wheezing Cardiovascular ROS: negative for - chest pain, dyspnea on exertion, edema or irregular heartbeat Gastrointestinal ROS: negative for - abdominal pain, diarrhea, hematemesis, nausea/vomiting or stool incontinence Genito-Urinary ROS: negative for - dysuria, hematuria, incontinence or urinary frequency/urgency Musculoskeletal ROS: negative for - joint swelling or muscular weakness Neurological ROS: as noted in HPI Dermatological ROS: negative for rash and skin lesion changes  Physical Examination: Blood pressure (!) 94/57, pulse 66, temperature (!)  97.5 F (36.4 C), temperature source Oral, resp. rate 13, height 5' (1.524 m), weight 45.1 kg (99 lb 8 oz), last menstrual period 06/11/2017, SpO2 100 %.  HEENT-  Normocephalic, no lesions, without obvious abnormality.  Normal external eye and conjunctiva.  Normal TM's bilaterally.  Normal auditory canals and external ears. Normal external nose, mucus membranes and septum.  Normal  pharynx. Cardiovascular- S1, S2 normal, pulses palpable throughout   Lungs- chest clear, no wheezing, rales, normal symmetric air entry Abdomen- soft, non-tender; bowel sounds normal; no masses,  no organomegaly Extremities- no edema Lymph-no adenopathy palpable Musculoskeletal-no joint tenderness, deformity or swelling Skin-warm and dry, no hyperpigmentation, vitiligo, or suspicious lesions  Neurological Examination   Mental Status: Alert, oriented, thought content appropriate.  Speech fluent without evidence of aphasia or dysarthria today .   Cranial Nerves: II: Discs flat bilaterally; Visual fields grossly normal, pupils equal, round, reactive to light and accommodation III,IV, VI: ptosis not present, extra-ocular motions intact bilaterally V,VII: smile symmetric, facial light touch sensation decreased on the right VIII: hearing normal bilaterally IX,X: gag reflex present XI: bilateral shoulder shrug XII: midline tongue extension Motor: BUE's at 4/5 bilaterally.  RLE 3+/5.  LLE 5-/5.  Normal tone. Tone and bulk:normal tone throughout; no atrophy noted Sensory: Pinprick and light touch intact throughout, bilaterally Deep Tendon Reflexes: 2+ in the upper extremities and absent in the lower extremities Plantars: Right: mute   Left: downgoing Cerebellar: Normal finger-to-nose and normal heel-to-shin testing bilaterally Gait: not tested due to safety concerns    Laboratory Studies:   Basic Metabolic Panel:  Recent Labs Lab 06/21/17 1420 06/22/17 0502  NA 135 136  K 4.2 3.9  CL 102 103  CO2 27 27  GLUCOSE 89 93  BUN 15 14  CREATININE 0.80 0.80  CALCIUM 9.8 9.4    Liver Function Tests:  Recent Labs Lab 06/21/17 1420  AST 22  ALT 19  ALKPHOS 53  BILITOT 0.4  PROT 8.7*  ALBUMIN 4.7   No results for input(s): LIPASE, AMYLASE in the last 168 hours. No results for input(s): AMMONIA in the last 168 hours.  CBC:  Recent Labs Lab 06/21/17 1420 06/22/17 0502   WBC 4.1 5.5  NEUTROABS 2.0  --   HGB 10.7* 9.7*  HCT 32.9* 31.1*  MCV 81.9 81.1  PLT 485* 444*    Cardiac Enzymes:  Recent Labs Lab 06/21/17 1424  CKTOTAL 132    BNP: Invalid input(s): POCBNP  CBG: No results for input(s): GLUCAP in the last 168 hours.  Microbiology: Results for orders placed or performed during the hospital encounter of 06/21/17  Gram stain     Status: None   Collection Time: 06/23/17  8:55 AM  Result Value Ref Range Status   Specimen Description CSF  Final   Special Requests NONE  Final   Gram Stain NO ORGANISMS SEEN FEW WBCS FEW RBCS   Final   Report Status 06/23/2017 FINAL  Final    Coagulation Studies: No results for input(s): LABPROT, INR in the last 72 hours.  Urinalysis:   Recent Labs Lab 06/21/17 1427  COLORURINE YELLOW*  LABSPEC 1.029  PHURINE 5.0  GLUCOSEU NEGATIVE  HGBUR NEGATIVE  BILIRUBINUR NEGATIVE  KETONESUR NEGATIVE  PROTEINUR NEGATIVE  NITRITE NEGATIVE  LEUKOCYTESUR MODERATE*    Lipid Panel:  No results found for: CHOL, TRIG, HDL, CHOLHDL, VLDL, LDLCALC  HgbA1C: No results found for: HGBA1C  Urine Drug Screen:      Component Value Date/Time   LABOPIA  NONE DETECTED 06/21/2017 1427   COCAINSCRNUR NONE DETECTED 06/21/2017 1427   LABBENZ POSITIVE (A) 06/21/2017 1427   AMPHETMU NONE DETECTED 06/21/2017 1427   THCU NONE DETECTED 06/21/2017 1427   LABBARB NONE DETECTED 06/21/2017 1427    Alcohol Level: No results for input(s): ETH in the last 168 hours.   Imaging: Mr Brain Wo Contrast  Result Date: 06/22/2017 CLINICAL DATA:  One week of progressive lower extremity weakness and slurred speech. EXAM: MRI HEAD WITHOUT CONTRAST TECHNIQUE: Multiplanar, multiecho pulse sequences of the brain and surrounding structures were obtained without intravenous contrast. COMPARISON:  Brain MRI 06/13/2017 FINDINGS: Brain: There is no evidence of acute infarct, intracranial hemorrhage, mass, midline shift, or extra-axial fluid  collection. The ventricles and sulci are normal. The brain is normal in signal. Vascular: Major intracranial vascular flow voids are preserved. Skull and upper cervical spine: Unremarkable bone marrow signal. Sinuses/Orbits: Unremarkable orbits. Minimal bilateral maxillary sinus mucosal thickening. Clear mastoid air cells. Other: None. IMPRESSION: Unremarkable appearance of the brain. No acute intracranial abnormality. Electronically Signed   By: Sebastian Ache M.D.   On: 06/22/2017 14:55   Dg Fluoro Guided Loc Of Needle/cath Tip For Spinal Inject Lt  Result Date: 06/23/2017 CLINICAL DATA:  Lower extremity weakness, slurred speech, history of scoliosis and seizure activity. EXAM: DIAGNOSTIC LUMBAR PUNCTURE UNDER FLUOROSCOPIC GUIDANCE FLUOROSCOPY TIME:  Fluoroscopy Time:  0 minutes, 30 seconds Radiation Exposure Index (if provided by the fluoroscopic device): 92 micro Gy per meters square Number of Acquired Spot Images: 1 screen save PROCEDURE: The anticipated procedure was discussed with the patient. A time-out procedure was called. There was no contraindication to the use of cutaneous iodine antiseptic is or injectable lidocaine. Informed consent was obtained from the patient prior to the procedure, including potential complications of headache, allergy, and pain. With the patient prone, the lower back was prepped with Betadine. 1% Lidocaine was used for local anesthesia. Lumbar puncture was performed at the L4 level using a 20 gauge needle with return of initially blood-tinged CSF. The opening pressure was subjectively normal. Five Ml of CSF were obtained for laboratory studies. The patient tolerated the procedure well and there were no apparent complications. IMPRESSION: The patient underwent successful lumbar puncture with acquisition of spinal fluid for purposes of laboratory evaluation. The patient tolerated the procedure well and was returned to the inpatient unit in stable condition. The patient is to  remain flat in bed for the next 8 hours being up for bathroom privileges and meals. Electronically Signed   By: David  [place] M.D.   On: 06/23/2017 09:04     Assessment/Plan: 31 year old female presenting with progressive weakness and slurred speech which improved and speaking in full sentences. No dysarthria today  She has generalized weakness more so in her legs which is give away weakness and changes with exam MRI brain and LP unremarkable in terms of cytology and other labs are pending.   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    06/23/2017, 12:34 PM

## 2017-06-23 NOTE — Progress Notes (Signed)
[employer] - [place] at [facility]   PATIENT NAME: Crystal CaffeyDenita White    MR#:  [id number]  DATE OF BIRTH:  1986/09/02  SUBJECTIVE:  CHIEF COMPLAINT:   Chief Complaint  Patient presents with  . Aphasia  . Cerebrovascular Accident   Continues to have speech problems and weakness. Husband at bedside feeding patient.  REVIEW OF SYSTEMS:    Review of Systems  Constitutional: Positive for malaise/fatigue. Negative for chills and fever.  HENT: Negative for sore throat.   Eyes: Negative for blurred vision, double vision and pain.  Respiratory: Negative for cough, hemoptysis, shortness of breath and wheezing.   Cardiovascular: Negative for chest pain, palpitations, orthopnea and leg swelling.  Gastrointestinal: Negative for abdominal pain, constipation, diarrhea, heartburn, nausea and vomiting.  Genitourinary: Negative for dysuria and hematuria.  Musculoskeletal: Negative for back pain and joint pain.  Skin: Negative for rash.  Neurological: Positive for speech change, focal weakness and weakness. Negative for sensory change and headaches.  Endo/Heme/Allergies: Does not bruise/bleed easily.  Psychiatric/Behavioral: Negative for depression. The patient is not nervous/anxious.     DRUG ALLERGIES:  No Known Allergies  VITALS:  Blood pressure (!) 94/57, pulse 66, temperature (!) 97.5 F (36.4 C), temperature source Oral, resp. rate 13, height 5' (1.524 m), weight 45.1 kg (99 lb 8 oz), last menstrual period 06/11/2017, SpO2 100 %.  PHYSICAL EXAMINATION:   Physical Exam  GENERAL:  31 y.o.-year-old patient lying in the bed with no acute distress.  EYES: Pupils equal, round, reactive to light and accommodation. No scleral icterus. Extraocular muscles intact.  HEENT: Head atraumatic, normocephalic. Oropharynx and nasopharynx clear.  NECK:  Supple, no jugular venous distention. No thyroid enlargement, no tenderness.  LUNGS: Normal breath sounds bilaterally, no wheezing,  rales, rhonchi. No use of accessory muscles of respiration.  CARDIOVASCULAR: S1, S2 normal. No murmurs, rubs, or gallops.  ABDOMEN: Soft, nontender, nondistended. Bowel sounds present. No organomegaly or mass.  EXTREMITIES: No cyanosis, clubbing or edema b/l.    NEUROLOGIC: Cranial nerves II through XII are intact.  Right and Left UE's at 4/5 RLE 4/5.  LLE 5-/5 PSYCHIATRIC: The patient is alert and oriented x 3.  SKIN: No obvious rash, lesion, or ulcer.   LABORATORY PANEL:   CBC  Recent Labs Lab 06/22/17 0502  WBC 5.5  HGB 9.7*  HCT 31.1*  PLT 444*   ------------------------------------------------------------------------------------------------------------------ Chemistries   Recent Labs Lab 06/21/17 1420 06/22/17 0502  NA 135 136  K 4.2 3.9  CL 102 103  CO2 27 27  GLUCOSE 89 93  BUN 15 14  CREATININE 0.80 0.80  CALCIUM 9.8 9.4  AST 22  --   ALT 19  --   ALKPHOS 53  --   BILITOT 0.4  --    ------------------------------------------------------------------------------------------------------------------  Cardiac Enzymes No results for input(s): TROPONINI in the last 168 hours. ------------------------------------------------------------------------------------------------------------------  RADIOLOGY:  Mr Brain Wo Contrast  Result Date: 06/22/2017 CLINICAL DATA:  One week of progressive lower extremity weakness and slurred speech. EXAM: MRI HEAD WITHOUT CONTRAST TECHNIQUE: Multiplanar, multiecho pulse sequences of the brain and surrounding structures were obtained without intravenous contrast. COMPARISON:  Brain MRI 06/13/2017 FINDINGS: Brain: There is no evidence of acute infarct, intracranial hemorrhage, mass, midline shift, or extra-axial fluid collection. The ventricles and sulci are normal. The brain is normal in signal. Vascular: Major intracranial vascular flow voids are preserved. Skull and upper cervical spine: Unremarkable bone marrow signal.  Sinuses/Orbits: Unremarkable orbits. Minimal bilateral maxillary sinus mucosal thickening. Clear  mastoid air cells. Other: None. IMPRESSION: Unremarkable appearance of the brain. No acute intracranial abnormality. Electronically Signed   By: Sebastian Ache M.D.   On: 06/22/2017 14:55   Dg Fluoro Guided Loc Of Needle/cath Tip For Spinal Inject Lt  Result Date: 06/23/2017 CLINICAL DATA:  Lower extremity weakness, slurred speech, history of scoliosis and seizure activity. EXAM: DIAGNOSTIC LUMBAR PUNCTURE UNDER FLUOROSCOPIC GUIDANCE FLUOROSCOPY TIME:  Fluoroscopy Time:  0 minutes, 30 seconds Radiation Exposure Index (if provided by the fluoroscopic device): 92 micro Gy per meters square Number of Acquired Spot Images: 1 screen save PROCEDURE: The anticipated procedure was discussed with the patient. A time-out procedure was called. There was no contraindication to the use of cutaneous iodine antiseptic is or injectable lidocaine. Informed consent was obtained from the patient prior to the procedure, including potential complications of headache, allergy, and pain. With the patient prone, the lower back was prepped with Betadine. 1% Lidocaine was used for local anesthesia. Lumbar puncture was performed at the L4 level using a 20 gauge needle with return of initially blood-tinged CSF. The opening pressure was subjectively normal. Five Ml of CSF were obtained for laboratory studies. The patient tolerated the procedure well and there were no apparent complications. IMPRESSION: The patient underwent successful lumbar puncture with acquisition of spinal fluid for purposes of laboratory evaluation. The patient tolerated the procedure well and was returned to the inpatient unit in stable condition. The patient is to remain flat in bed for the next 8 hours being up for bathroom privileges and meals. Electronically Signed   By: David  [place] M.D.   On: 06/23/2017 09:04     ASSESSMENT AND PLAN:   * Dysarthria and muscle  weakness. MRI of the brain showed no acute stroke. No seizures at this time. Etiology unclear. Discussed with Dr. Mechele Collin.  Lumbar puncture normal. Psychogenic? Will have PT work with patient Home with HH vs SNF  * seizure disorder. Continue Lamictal.  * depression. We will request psychiatry consultation.  * hypertension. On home medications. Well-controlled.  * asthma. No acute exacerbation.  All the records are reviewed and case discussed with Care Management/Social Worker Management plans discussed with the patient, family and they are in agreement.  CODE STATUS: FULL CODE  DVT Prophylaxis: SCDs  TOTAL TIME TAKING CARE OF THIS PATIENT: 35 minutes.   POSSIBLE D/C IN 1-2 DAYS, DEPENDING ON CLINICAL CONDITION.  Milagros Loll R M.D on 06/23/2017 at 1:05 PM  Between 7am to 6pm - Pager - [phone]  After 6pm go to [email or web address] - password [email or web address] [facility]  [facility] [place] [facility]  Office  [phone]  CC: Primary care physician; Thornton Papas, PA-C  Note: This dictation was prepared with Dragon dictation along with smaller phrase technology. Any transcriptional errors that result from this process are unintentional.

## 2017-06-23 NOTE — Progress Notes (Signed)
PT Cancellation Note  Patient Details Name: Crystal CaffeyDenita White MRN: [id number] DOB: [birth year]   Cancelled Treatment:    Reason Eval/Treat Not Completed: Medical issues which prohibited therapy (Consult received and chart reviewed.  Patient status post LP this AM [phone]) with instructions that "the patient is to remain flat in bed for the next 8 hours being up for bathroom privileges and meals".  Will hold mobility assessment until bed-rest time complete; will re-attempt PT evaluation next date.)   Fern Asmar H. Manson PasseyBrown, PT, DPT, NCS 06/23/17, 11:22 AM [phone]

## 2017-06-23 NOTE — Progress Notes (Signed)
Called to give patient prn breathing treatment by patient's nurse Meghan. Upon arrival in room pt was on the telephone continuing to talk on the phone and asked that I leave so " I wouldn't be hearing her business" (pt's words)  Patient did not seem to be in any distress while she continued her conversation on the phone.  I made the RN aware of pt's refusal of breathing treatment.

## 2017-06-24 LAB — ACETYLCHOLINE RECEPTOR, BINDING: Acety choline binding ab: <0.03 nmol/L (ref 0.00–0.24)

## 2017-06-24 LAB — STRIATED MUSCLE ANTIBODY: Anti-striation Abs: NEGATIVE

## 2017-06-24 LAB — ANTI-SMOOTH MUSCLE ANTIBODY, IGG: F-Actin IgG: 15 Units (ref 0–19)

## 2017-06-24 LAB — T3: T3, Total: 91 ng/dL (ref 71–180)

## 2017-06-24 LAB — HEAVY METALS, BLOOD
Arsenic: 5 ug/L (ref 2–23)
LEAD: NOT DETECTED ug/dL (ref 0–4)
MERCURY: NOT DETECTED ug/L (ref 0.0–14.9)

## 2017-06-24 LAB — RPR: RPR Ser Ql: NONREACTIVE

## 2017-06-24 MED ORDER — ADVAIR DISKUS 250-50 MCG/DOSE IN AEPB: 1.0000 | INHALATION_SPRAY | Freq: Two times a day (BID) | RESPIRATORY_TRACT | 0 refills | Status: DC

## 2017-06-24 MED ORDER — ENOXAPARIN SODIUM 40 MG/0.4ML ~~LOC~~ SOLN
40.0000 mg | SUBCUTANEOUS | Status: DC
Start: 2017-06-24 — End: 2017-06-24

## 2017-06-24 MED ORDER — ALBUTEROL SULFATE (2.5 MG/3ML) 0.083% IN NEBU: 3.0000 mL | INHALATION_SOLUTION | Freq: Four times a day (QID) | RESPIRATORY_TRACT | 0 refills | Status: DC | PRN

## 2017-06-24 NOTE — Progress Notes (Signed)
Inpatient Rehabilitation  Per PT request, patient was screened by Fae PippinMelissa Jaeleah Smyser for appropriateness for an Inpatient Acute Rehab consult.  We need a working diagnosis prior to considering patient for an IP Rehab consult and potential admission.  I discussed case with Steward DroneBrenda RN case manager.  Please notify if a diagnosis is added. Thank you.  Charlane FerrettiMelissa Keyonia Gluth, M.A., CCC/SLP Admission Coordinator  [facility]  Cell [phone]

## 2017-06-24 NOTE — Progress Notes (Signed)
Patient discharged home with out patient therapy. IV removed without complication. Discharge instructions and PCP/Neurology follow ups reviewed. Patient denies questions about her care at this time. Husband at bedside.

## 2017-06-24 NOTE — Evaluation (Signed)
Occupational Therapy Evaluation Patient Details Name: Crystal Villegas MRN: [id number] DOB: September 25, 1986 Today's Date: 06/24/2017    History of Present Illness presentd to ER secondary to R-sided weakness; admitted for TIA/CVA work up.  MRI of head, c-spine negative for acute finding; LP normal.     Clinical Impression   Pt seen for OT evaluation this date. Prior to hospital admission, pt was independent with functional mobility, ADL, IADL, including school and caring for 3 children.  Pt lives with spouse and kids in single family home. Pt became tearful when provided with additional education into CIR vs SNF vs other rehab options, requesting to return home with her family members around. Pt reports being fearful of falling and does not want to be around strangers. Currently pt requires min guard for functional mobility with RW and able to perform self care tasks from seated position safely. Pt would benefit from skilled OT to address noted impairments and functional limitations (see below for any additional details).  Upon hospital discharge, recommend pt discharge home with OP OT to address balance and strength deficits as it relates to functional occupational performance.    Follow Up Recommendations  Outpatient OT    Equipment Recommendations  None recommended by OT    Recommendations for Other Services       Precautions / Restrictions Precautions Precautions: Fall Restrictions Weight Bearing Restrictions: No      Mobility Bed Mobility Overal bed mobility: Modified Independent                Transfers Overall transfer level: Needs assistance Equipment used: Rolling walker (2 wheeled) Transfers: Sit to/from Stand Sit to Stand: Min guard         General transfer comment: min guard, vc for hand placement, hesitant to perform    Balance Overall balance assessment: Needs assistance Sitting-balance support: No upper extremity supported;Feet supported Sitting  balance-Leahy Scale: Good     Standing balance support: Bilateral upper extremity supported Standing balance-Leahy Scale: Fair                             ADL either performed or assessed with clinical judgement   ADL Overall ADL's : Needs assistance/impaired Eating/Feeding: Sitting;Set up   Grooming: Sitting;Set up   Upper Body Bathing: Sitting;Set up   Lower Body Bathing: Sitting/lateral leans;Set up;Supervison/ safety   Upper Body Dressing : Sitting;Set up   Lower Body Dressing: Sitting/lateral leans;Sit to/from stand;Set up Lower Body Dressing Details (indicate cue type and reason): pt doffed/donned socks seated EOB with no LOB, no difficulty Toilet Transfer: Min guard;Ambulation;Regular Toilet;RW Toilet Transfer Details (indicate cue type and reason): no LOB, good safety awareness         Functional mobility during ADLs: Rolling walker;Min guard       Vision Baseline Vision/History: Wears glasses Wears Glasses: At all times Patient Visual Report: No change from baseline Vision Assessment?: No apparent visual deficits     Perception     Praxis      Pertinent Vitals/Pain Pain Assessment: No/denies pain     Hand Dominance Right   Extremity/Trunk Assessment Upper Extremity Assessment Upper Extremity Assessment: Overall WFL for tasks assessed (4+/5 bilaterally, intact sensation)   Lower Extremity Assessment Lower Extremity Assessment: Defer to PT evaluation;Generalized weakness   Cervical / Trunk Assessment Cervical / Trunk Assessment: Normal   Communication Communication Communication: No difficulties   Cognition Arousal/Alertness: Awake/alert Behavior During Therapy: WFL for tasks assessed/performed Overall Cognitive  Status: Within Functional Limits for tasks assessed                                     General Comments       Exercises   Shoulder Instructions      Home Living Family/patient expects to be discharged  to:: Private residence Living Arrangements: Spouse/significant other;Children Available Help at Discharge: Family Type of Home: House Home Access: Stairs to enter Secretary/administratorntrance Stairs-Number of Steps: 3 Entrance Stairs-Rails: Left Home Layout: One level     Bathroom Shower/Tub: Tub/shower unit;Door   Bathroom Toilet: Standard     Home Equipment: Walker - 2 wheels          Prior Functioning/Environment Level of Independence: Independent        Comments: Indep with ADLs, household and community mobility; attending school; denies fall history.  no home O2; not currently working.        OT Problem List: Decreased strength;Impaired balance (sitting and/or standing)      OT Treatment/Interventions: Self-care/ADL training;Balance training;Therapeutic exercise;Therapeutic activities;Patient/family education    OT Goals(Current goals can be found in the care plan section) Acute Rehab OT Goals Patient Stated Goal: go home OT Goal Formulation: With patient/family Time For Goal Achievement: 07/01/17 Potential to Achieve Goals: Good  OT Frequency: Min 1X/week   Barriers to D/C:            Co-evaluation              AM-PAC PT "6 Clicks" Daily Activity     Outcome Measure Help from another person eating meals?: None Help from another person taking care of personal grooming?: None Help from another person toileting, which includes using toliet, bedpan, or urinal?: A Little Help from another person bathing (including washing, rinsing, drying)?: A Little Help from another person to put on and taking off regular upper body clothing?: None Help from another person to put on and taking off regular lower body clothing?: A Little 6 Click Score: 21   End of Session Equipment Utilized During Treatment: Rolling walker;Gait belt  Activity Tolerance: Patient tolerated treatment well Patient left: in bed;with call bell/phone within reach;with bed alarm set;with family/visitor  present  OT Visit Diagnosis: Other abnormalities of gait and mobility (R26.89);Muscle weakness (generalized) (M62.81)                Time: [id number] OT Time Calculation (min): 24 min Charges:  OT General Charges $OT Visit: 1 Visit OT Evaluation $OT Eval Low Complexity: 1 Low OT Treatments $Self Care/Home Management : 8-22 mins G-Codes: OT G-codes **NOT FOR INPATIENT CLASS** Functional Assessment Tool Used: AM-PAC 6 Clicks Daily Activity;Clinical judgement Functional Limitation: Self care Self Care Current Status (V4098(G8987): At least 20 percent but less than 40 percent impaired, limited or restricted Self Care Goal Status (J1914(G8988): At least 1 percent but less than 20 percent impaired, limited or restricted   Richrd PrimeJamie Stiller, MPH, MS, OTR/L ascom [phone] 06/24/17, 4:00 PM

## 2017-06-24 NOTE — Progress Notes (Signed)
Subjective: Patient improved today.  Speech very close to baseline.  Less paresthesia.  Continues to ambulate with walker.    Objective: Current vital signs: BP (!) 106/59 (BP Location: Left Arm)   Pulse 92   Temp 98 F (36.7 C) (Oral)   Resp 16   Ht 5' (1.524 m)   Wt 45.1 kg (99 lb 8 oz)   LMP 06/11/2017   SpO2 100%   BMI 19.43 kg/m  Vital signs in last 24 hours: Temp:  [97.5 F (36.4 C)-98.3 F (36.8 C)] 98 F (36.7 C) (09/11 0800) Pulse Rate:  [92-136] 92 (09/11 0800) Resp:  [13-20] 16 (09/11 0800) BP: (90-107)/(53-59) 106/59 (09/11 0800) SpO2:  [99 %-100 %] 100 % (09/11 0800)  Intake/Output from previous day: 09/10 0701 - 09/11 0700 In: 480 [P.O.:480] Out: -  Intake/Output this shift: No intake/output data recorded. Nutritional status: Diet regular Room service appropriate? Yes; Fluid consistency: Thin  Neurologic Exam: Mental Status: Alert, oriented, thought content appropriate.  Speech fluent without evidence of aphasia.  NO dysarthria with speech on the telephone prior to our conversation but the longer we talk the more the patient has some slurring.  Able to follow 3 step commands without difficulty. Cranial Nerves: II: Discs flat bilaterally; Visual fields grossly normal, pupils equal, round, reactive to light and accommodation III,IV, VI: ptosis not present, extra-ocular motions intact bilaterally V,VII: smile symmetric, facial light touch sensation normal bilaterally VIII: hearing normal bilaterally IX,X: gag reflex present XI: bilateral shoulder shrug XII: midline tongue extension Motor: Lifts both upper extremities strongly against gravity.     Lab Results: Basic Metabolic Panel:  Recent Labs Lab 06/21/17 1420 06/22/17 0502  NA 135 136  K 4.2 3.9  CL 102 103  CO2 27 27  GLUCOSE 89 93  BUN 15 14  CREATININE 0.80 0.80  CALCIUM 9.8 9.4    Liver Function Tests:  Recent Labs Lab 06/21/17 1420  AST 22  ALT 19  ALKPHOS 53  BILITOT 0.4   PROT 8.7*  ALBUMIN 4.7   No results for input(s): LIPASE, AMYLASE in the last 168 hours. No results for input(s): AMMONIA in the last 168 hours.  CBC:  Recent Labs Lab 06/21/17 1420 06/22/17 0502  WBC 4.1 5.5  NEUTROABS 2.0  --   HGB 10.7* 9.7*  HCT 32.9* 31.1*  MCV 81.9 81.1  PLT 485* 444*    Cardiac Enzymes:  Recent Labs Lab 06/21/17 1424  CKTOTAL 132    Lipid Panel: No results for input(s): CHOL, TRIG, HDL, CHOLHDL, VLDL, LDLCALC in the last 168 hours.  CBG: No results for input(s): GLUCAP in the last 168 hours.  Microbiology: Results for orders placed or performed during the hospital encounter of 06/21/17  Gram stain     Status: None   Collection Time: 06/23/17  8:55 AM  Result Value Ref Range Status   Specimen Description CSF  Final   Special Requests NONE  Final   Gram Stain NO ORGANISMS SEEN FEW WBCS FEW RBCS   Final   Report Status 06/23/2017 FINAL  Final    Coagulation Studies: No results for input(s): LABPROT, INR in the last 72 hours.  Imaging: Mr Brain Wo Contrast  Result Date: 06/22/2017 CLINICAL DATA:  One week of progressive lower extremity weakness and slurred speech. EXAM: MRI HEAD WITHOUT CONTRAST TECHNIQUE: Multiplanar, multiecho pulse sequences of the brain and surrounding structures were obtained without intravenous contrast. COMPARISON:  Brain MRI 06/13/2017 FINDINGS: Brain: There is no evidence of  acute infarct, intracranial hemorrhage, mass, midline shift, or extra-axial fluid collection. The ventricles and sulci are normal. The brain is normal in signal. Vascular: Major intracranial vascular flow voids are preserved. Skull and upper cervical spine: Unremarkable bone marrow signal. Sinuses/Orbits: Unremarkable orbits. Minimal bilateral maxillary sinus mucosal thickening. Clear mastoid air cells. Other: None. IMPRESSION: Unremarkable appearance of the brain. No acute intracranial abnormality. Electronically Signed   By: Sebastian Ache M.D.    On: 06/22/2017 14:55   Mr Cervical Spine W Wo Contrast  Result Date: 06/23/2017 CLINICAL DATA:  Initial evaluation for generalized weakness, progressively worsening, history of seizure and recent syncopal episode. EXAM: MRI CERVICAL SPINE WITHOUT AND WITH CONTRAST TECHNIQUE: Multiplanar and multiecho pulse sequences of the cervical spine, to include the craniocervical junction and cervicothoracic junction, were obtained without and with intravenous contrast. CONTRAST:  9mL MULTIHANCE GADOBENATE DIMEGLUMINE 529 MG/ML IV SOLN COMPARISON:  Comparison made with prior CT from 10/20/2014. FINDINGS: Alignment: Mild levoscoliosis with straightening of the normal cervical lordosis. No listhesis. Vertebrae: Vertebral body heights well maintained. No evidence for acute or chronic fracture. Signal intensity within the vertebral body bone marrow is normal. No discrete or worrisome osseous lesions. No abnormal enhancement. Cord: Signal intensity within the cervical spinal cord is normal. Cord caliber within normal limits. No focal lesion or abnormal enhancement. Posterior Fossa, vertebral arteries, paraspinal tissues: Visualized brain and posterior fossa are within normal limits. Craniocervical junction normal. Posterior and prevertebral soft tissues within normal limits. No abnormal enhancement. Susceptibility artifact from spinal fusion hardware partially visualize within the upper thoracic spine. Normal intravascular flow voids present within the vertebral arteries bilaterally. Disc levels: C2-C3: Unremarkable. C3-C4:  Unremarkable. C4-C5:  Unremarkable. C5-C6: Shallow left a centric disc bulge flattens the ventral thecal sac. No significant spinal stenosis. Foramina remain widely patent. C6-C7:  Unremarkable. C7-T1:  Unremarkable. IMPRESSION: 1. No acute abnormality within the cervical spine. No significant stenosis or cord signal abnormality. 2. Shallow left eccentric disc bulge at C5-6 without stenosis. Electronically  Signed   By: Rise Mu M.D.   On: 06/23/2017 21:16   Dg Fluoro Guided Loc Of Needle/cath Tip For Spinal Inject Lt  Result Date: 06/23/2017 CLINICAL DATA:  Lower extremity weakness, slurred speech, history of scoliosis and seizure activity. EXAM: DIAGNOSTIC LUMBAR PUNCTURE UNDER FLUOROSCOPIC GUIDANCE FLUOROSCOPY TIME:  Fluoroscopy Time:  0 minutes, 30 seconds Radiation Exposure Index (if provided by the fluoroscopic device): 92 micro Gy per meters square Number of Acquired Spot Images: 1 screen save PROCEDURE: The anticipated procedure was discussed with the patient. A time-out procedure was called. There was no contraindication to the use of cutaneous iodine antiseptic is or injectable lidocaine. Informed consent was obtained from the patient prior to the procedure, including potential complications of headache, allergy, and pain. With the patient prone, the lower back was prepped with Betadine. 1% Lidocaine was used for local anesthesia. Lumbar puncture was performed at the L4 level using a 20 gauge needle with return of initially blood-tinged CSF. The opening pressure was subjectively normal. Five Ml of CSF were obtained for laboratory studies. The patient tolerated the procedure well and there were no apparent complications. IMPRESSION: The patient underwent successful lumbar puncture with acquisition of spinal fluid for purposes of laboratory evaluation. The patient tolerated the procedure well and was returned to the inpatient unit in stable condition. The patient is to remain flat in bed for the next 8 hours being up for bathroom privileges and meals. Electronically Signed   By: David  [place]  M.D.   On: 06/23/2017 09:04    Medications:  I have reviewed the patient's current medications. Scheduled: . aspirin EC  81 mg Oral Daily  . lamoTRIgine  25 mg Oral q morning - 10a  . lamoTRIgine  50 mg Oral QHS  . mirtazapine  15 mg Oral QHS  . mometasone-formoterol  2 puff Inhalation BID     Assessment/Plan: Patient improved.  MRI of the brain repeated and remains normal.  LP showed only 2 wbcs.  Protein and glucose are normal.  Remaining tests are pending.    Recommendations: 1.  Patient to continue neurological follow up on an outpatient basis.     LOS: 3 days   Thana Farr, MD Neurology [phone] 06/24/2017  11:07 AM

## 2017-06-24 NOTE — Evaluation (Addendum)
Physical Therapy Evaluation Patient Details Name: Crystal CaffeyDenita Villegas MRN: [id number] DOB: Nov 05, 1985 Today's Date: 06/24/2017   History of Present Illness  presentd to ER secondary to R-sided weakness; admitted for TIA/CVA work up.  MRI of head, c-spine negative for acute finding; LP normal.    Clinical Impression  Upon evaluation, patient alert and oriented; follows all commands and demonstrates fair/good insight and safety awareness.  Speech clear and intelligible (mildly dysarthric towards end of session, very inconsistent throughout session).  R UE/LE with mild weakness noted during isolated strength testing, but able to use functionally (reaching for daughter, putting on socks) without hesitation or difficulty.  Performance and ability somewhat inconsistent throughout session.  Demonstrates ability to complete bed mobility with mod indep; sit/stand, basic transfers and gait (25') without assist device, min/mod assist.  Poor static and dynamic standing balance; significant scissoring and toe-walking throughout gait distance.  Able to correct with verbal cuing, but requires constant cuing/attention to task for foot placement, flat foot contact and standing balance. Unsafe to attempt without physical assist from helper at this time. Would benefit from skilled PT to address above deficits and promote optimal return to PLOF;recommend transition to acute inpatient rehab upon discharge for high-intensity, post-acute rehab services.      Follow Up Recommendations CIR    Equipment Recommendations  Rolling walker with 5" wheels    Recommendations for Other Services       Precautions / Restrictions Precautions Precautions: Fall Restrictions Weight Bearing Restrictions: No      Mobility  Bed Mobility Overal bed mobility: Modified Independent                Transfers Overall transfer level: Needs assistance Equipment used: Rolling walker (2 wheeled) Transfers: Sit to/from Stand Sit to  Stand: Min assist;Mod assist         General transfer comment: using bilat UEs on RW to assist with lift off and standing balance  Ambulation/Gait Ambulation/Gait assistance: Mod assist;Min assist Ambulation Distance (Feet): 25 Feet Assistive device: Rolling walker (2 wheeled)       General Gait Details: tends to walk on ball of foot/toes, maintains hip/knee flexion throughout gait cycle; significant scissoring (nearly spastic-appearing gait pattern).  Heavy forward trunk lean with excessive anterior weight shift  Stairs            Wheelchair Mobility    Modified Rankin (Stroke Patients Only)       Balance Overall balance assessment: Needs assistance Sitting-balance support: No upper extremity supported;Feet supported Sitting balance-Leahy Scale: Good     Standing balance support: Bilateral upper extremity supported Standing balance-Leahy Scale: Poor                               Pertinent Vitals/Pain Pain Assessment: No/denies pain    Home Living Family/patient expects to be discharged to:: Private residence Living Arrangements: Spouse/significant other;Children Available Help at Discharge: Family Type of Home: House Home Access: Stairs to enter Entrance Stairs-Rails: Right;Left;Can reach both Secretary/administratorntrance Stairs-Number of Steps: 3 Home Layout: One level Home Equipment: Walker - 2 wheels      Prior Function Level of Independence: Independent         Comments: Indep with ADLs, household and community mobility; denies fall history.  no home O2; not currently working.     Hand Dominance   Dominant Hand: Right    Extremity/Trunk Assessment   Upper Extremity Assessment Upper Extremity Assessment:  (R UE grossly  3-/5, shoulder elevation limited by pain (mild compensatory hiking to aid wtih end-range movement).  Denies sensory deficit; good coordination, functional use with automatic activities)    Lower Extremity Assessment Lower  Extremity Assessment: Generalized weakness (R LE grossly 4-/5 (mild give-way), L LE grossly 4/5; negative babinski, negative clonus.  Denies sensory deficit.  Difficult to assess tone due to limited relaxation.)       Communication   Communication: No difficulties (some degree of slurring/dysarthria towards end of session; remains intelligible, able to communicate wants/needs)  Cognition Arousal/Alertness: Awake/alert Behavior During Therapy: WFL for tasks assessed/performed Overall Cognitive Status: Within Functional Limits for tasks assessed                                        General Comments      Exercises Other Exercises Other Exercises: 25' x1 without assist device (bilat UEs holding therapist arm), mod assist +1-constant cuing for LE foot placement, flat foot contact during loading and standing balacne. Very slow and deliberate. Other Exercises: 59' x1 with RW, min assist-fair/good carry-over of more normalized gait pattern, but requires constant cuing/attention    Assessment/Plan    PT Assessment Patient needs continued PT services  PT Problem List Decreased range of motion;Decreased activity tolerance;Decreased balance;Decreased strength;Decreased mobility;Decreased coordination;Decreased cognition;Decreased knowledge of use of DME;Decreased safety awareness;Decreased knowledge of precautions       PT Treatment Interventions DME instruction;Gait training;Stair training;Functional mobility training;Therapeutic activities;Therapeutic exercise;Balance training;Patient/family education    PT Goals (Current goals can be found in the Care Plan section)  Acute Rehab PT Goals Patient Stated Goal: to get stronger and walk better PT Goal Formulation: With patient/family Time For Goal Achievement: 07/08/17 Potential to Achieve Goals: Good    Frequency Min 2X/week   Barriers to discharge        Co-evaluation               AM-PAC PT "6 Clicks" Daily  Activity  Outcome Measure Difficulty turning over in bed (including adjusting bedclothes, sheets and blankets)?: None Difficulty moving from lying on back to sitting on the side of the bed? : None Difficulty sitting down on and standing up from a chair with arms (e.g., wheelchair, bedside commode, etc,.)?: Unable Help needed moving to and from a bed to chair (including a wheelchair)?: A Lot Help needed walking in hospital room?: A Lot Help needed climbing 3-5 steps with a railing? : A Lot 6 Click Score: 15    End of Session Equipment Utilized During Treatment: Gait belt Activity Tolerance: Patient tolerated treatment well Patient left: in bed;with call bell/phone within reach;with bed alarm set Nurse Communication: Mobility status PT Visit Diagnosis: Muscle weakness (generalized) (M62.81);Difficulty in walking, not elsewhere classified (R26.2)    Time: 0865-7846 PT Time Calculation (min) (ACUTE ONLY): 36 min   Charges:   PT Evaluation $PT Eval Moderate Complexity: 1 Mod PT Treatments $Gait Training: 8-22 mins $Therapeutic Activity: 8-22 mins   PT G Codes:   PT G-Codes **NOT FOR INPATIENT CLASS** Functional Assessment Tool Used: AM-PAC 6 Clicks Basic Mobility Functional Limitation: Mobility: Walking and moving around Mobility: Walking and Moving Around Current Status (N6295): At least 40 percent but less than 60 percent impaired, limited or restricted Mobility: Walking and Moving Around Goal Status [phone]): At least 1 percent but less than 20 percent impaired, limited or restricted    Garrett Bowring H. Manson Passey, PT, DPT, NCS  06/24/17, 2:49 PM [phone]

## 2017-06-24 NOTE — Progress Notes (Signed)
[facility] Dr. Sherryll BurgerShah recommending Cervical MRI. Ordered and has returned normal. Waiting for PT eval for DC.

## 2017-06-24 NOTE — Care Management (Signed)
Physical therapy evaluation completed. Recommending Inpatient Acute Rehabilitation.  Crystal PippinMelissa Villegas, representative for Inpatient Acute reviewed information. No diagnosis that is appropriate for this plan Crystal Villegas is in agreement for outpatient therapy. Referral signed and faxed to Rehabilitation department Discharge to home today per Dr Ok AnisSusini Crystal GreetBrenda S [name] RN MSN CCM Care Management [phone]

## 2017-06-24 NOTE — Clinical Social Work Note (Signed)
CSW received referral for SNF.  Case discussed with case manager and plan is to discharge with outpatient therapy.  CSW to sign off please re-consult if social work needs arise.  Ervin KnackEric R. Javonn Gauger, MSW, [employer]CSWA [phone]

## 2017-06-24 NOTE — Progress Notes (Signed)
SLP Cancellation Note  Patient Details Name: Crystal CaffeyDenita Villegas MRN: [id number] DOB: [birth year]   Cancelled treatment:       Reason Eval/Treat Not Completed: SLP screened, no needs identified, will sign off (chart reviewed; consulted NSG then pt/husband).  Pt denied any difficulty swallowing to NSG and is currently on a regular diet; tolerates swallowing pills w/ water per NSG. Pt has conversed w/ MDs and NSG at conversational level w/ no significant deficits noted; speech very close to baseline. MRI of the brain repeated and remains normal. Noted pt's positive drug screen at baseline; Neurologist has suggested a psychogenic component, depression to this presentation.  No further skilled ST services indicated as pt appears at, close to, her baseline. Pt to f/u w/ primary MD if any needs post discharge. NSG to reconsult if any change in status.    Jerilynn SomKatherine Watson, MS, CCC-SLP Watson,Katherine 06/24/2017, 11:49 AM

## 2017-06-25 LAB — IGG CSF INDEX
Albumin CSF-mCnc: 22 mg/dL (ref 11–48)
Albumin: 4.7 g/dL (ref 3.5–5.5)
CSF IgG Index: 0.6 (ref 0.0–0.7)
IGG CSF: 4 mg/dL (ref 0.0–8.6)
IgG (Immunoglobin G), Serum: 1523 mg/dL (ref 700–1600)
IgG/Alb Ratio, CSF: 0.18 (ref 0.00–0.25)

## 2017-06-25 LAB — VITAMIN B1: Vitamin B1 (Thiamine): 116.8 nmol/L (ref 66.5–200.0)

## 2017-06-25 LAB — MYELIN BASIC PROTEIN, CSF: MYELIN BASIC PROTEIN: 1.7 ng/mL — AB (ref 0.0–1.2)

## 2017-06-26 LAB — OLIGOCLONAL BANDS, CSF + SERM

## 2017-06-28 NOTE — Discharge Summary (Signed)
[facility] [place] [facility]   PATIENT NAME: Crystal Villegas    MR#:  [id number]  DATE OF BIRTH:  1986/02/28  DATE OF ADMISSION:  06/21/2017 ADMITTING PHYSICIAN: Oralia Manis, MD  DATE OF DISCHARGE: 06/24/2017  5:30 PM  PRIMARY CARE PHYSICIAN: Thornton Papas, PA-C   ADMISSION DIAGNOSIS:  Slurred speech [R47.81] Leg weakness, bilateral [R29.898]  DISCHARGE DIAGNOSIS:  Principal Problem:   Muscle weakness Active Problems:   Seizure (HCC)   HTN (hypertension)   Asthma   SECONDARY DIAGNOSIS:   Past Medical History:  Diagnosis Date  . Asthma   . Hypertension   . Scoliosis   . Seizures (HCC)      ADMITTING HISTORY  HISTORY OF PRESENT ILLNESS:  Crystal Villegas  is a 31 y.o. female who presents with about a week of progressive lower extremity weakness and slurred speech. Patient was seen and evaluated here with MRI of the brain which was normal several days ago.  He states that her weakness has gotten progressively worse, and she has had to use a walker now just to get around. Her slurred speech has also not improved. Patient was originally in the ED earlier today, became somewhat belligerent and left. Per chart review her family convinced her to come back, but on this writer's exam she is minimally cooperative, answering mostly only yes or no, and only minimally cooperative on exam.   HOSPITAL COURSE:   * dysarthria and muscle weakness. Patient had MRI of the brain and C-spine which were normal.No seizures. LP returned normal. Discussed with Dr. Thad Ranger and Dr. Loretha Brasil with neurology. 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 Psychiatry has seen the patient. Started on Remeron. Patient is being discharged homewith physical therapy She continues to have some weakness in her lower extremities along with difficulty speaking. But these seemed to improve when patient is distracted.  Stable for discharge home.  CONSULTS OBTAINED:  Treatment Team:  Thana Farr,  MD Kym Groom, MD Shari Prows, MD Clapacs, Jackquline Denmark, MD  DRUG ALLERGIES:  No Known Allergies  DISCHARGE MEDICATIONS:   Discharge Medication List as of 06/24/2017  5:08 PM    CONTINUE these medications which have CHANGED   Details  ADVAIR DISKUS 250-50 MCG/DOSE AEPB Inhale 1 puff into the lungs 2 (two) times daily., Starting Tue 06/24/2017, Normal    albuterol (PROVENTIL) (2.5 MG/3ML) 0.083% nebulizer solution Inhale 3 mLs into the lungs every 6 (six) hours as needed for wheezing or shortness of breath., Starting Tue 06/24/2017, Normal      CONTINUE these medications which have NOT CHANGED   Details  amoxicillin-clavulanate (AUGMENTIN) 875-125 MG tablet Take 1 tablet by mouth every 12 (twelve) hours., Starting Fri 06/13/2017, Print    aspirin EC 81 MG tablet Take 81 mg by mouth daily., Until Discontinued, Historical Med    docusate sodium (COLACE) 100 MG capsule Take 100 mg by mouth 2 (two) times daily., Until Discontinued, Historical Med    ibuprofen (ADVIL,MOTRIN) 800 MG tablet Take 1 tablet (800 mg total) by mouth every 8 (eight) hours as needed., Starting Tue 04/23/2016, Print    !! lamoTRIgine (LAMICTAL) 25 MG tablet Take 1 tablet (25 mg total) by mouth 2 (two) times daily., Starting Fri 06/13/2017, Print    !! lamoTRIgine (LAMICTAL) 25 MG tablet Take 50 mg by mouth at bedtime., Historical Med    Multiple Vitamins-Calcium (ONE-A-DAY WOMENS FORMULA) TABS Take 1 tablet by mouth daily., Until Discontinued, Historical Med     !! - Potential duplicate  medications found. Please discuss with provider.      Today   VITAL SIGNS:  Blood pressure 109/61, pulse 93, temperature 98.2 F (36.8 C), temperature source Oral, resp. rate 16, height 5' (1.524 m), weight 45.1 kg (99 lb 8 oz), last menstrual period 06/11/2017, SpO2 100 %.  I/O:  No intake or output data in the 24 hours ending 06/28/17 1126  PHYSICAL EXAMINATION:  Physical Exam  GENERAL:  31 y.o.-year-old  patient lying in the bed with no acute distress.  LUNGS: Normal breath sounds bilaterally, no wheezing, rales,rhonchi or crepitation. No use of accessory muscles of respiration.  CARDIOVASCULAR: S1, S2 normal. No murmurs, rubs, or gallops.  ABDOMEN: Soft, non-tender, non-distended. Bowel sounds present. No organomegaly or mass.  NEUROLOGIC: Moves all 4 extremities. PSYCHIATRIC: The patient is alert and oriented x 3.  SKIN: No obvious rash, lesion, or ulcer.   DATA REVIEW:   CBC  Recent Labs Lab 06/22/17 0502  WBC 5.5  HGB 9.7*  HCT 31.1*  PLT 444*    Chemistries   Recent Labs Lab 06/21/17 1420 06/22/17 0502  NA 135 136  K 4.2 3.9  CL 102 103  CO2 27 27  GLUCOSE 89 93  BUN 15 14  CREATININE 0.80 0.80  CALCIUM 9.8 9.4  AST 22  --   ALT 19  --   ALKPHOS 53  --   BILITOT 0.4  --     Cardiac Enzymes No results for input(s): TROPONINI in the last 168 hours.  Microbiology Results  Results for orders placed or performed during the hospital encounter of 06/21/17  Gram stain     Status: None   Collection Time: 06/23/17  8:55 AM  Result Value Ref Range Status   Specimen Description CSF  Final   Special Requests NONE  Final   Gram Stain NO ORGANISMS SEEN FEW WBCS FEW RBCS   Final   Report Status 06/23/2017 FINAL  Final    RADIOLOGY:  No results found.  Follow up with PCP in 1 week.  Management plans discussed with the patient, family and they are in agreement.  CODE STATUS:  Code Status History    Date Active Date Inactive Code Status Order ID Comments User Context   06/21/2017  9:35 PM 06/24/2017  9:00 PM Full Code [id number]  Oralia Manis, MD ED   06/12/2017  3:40 PM 06/13/2017  7:47 PM Full Code [id number]  Ramonita Lab, MD ED      TOTAL TIME TAKING CARE OF THIS PATIENT ON DAY OF DISCHARGE: more than 30 minutes.   Milagros Loll R M.D on 06/28/2017 at 11:26 AM  Between 7am to 6pm - Pager - [phone]  After 6pm go to [email or web address] - password [email or web address]  [facility]  [facility]  Office  [phone]  CC: Primary care physician; Thornton Papas, PA-C  Note: This dictation was prepared with Dragon dictation along with smaller phrase technology. Any transcriptional errors that result from this process are unintentional.

## 2017-08-13 ENCOUNTER — Ambulatory Visit: Payer: Medicaid Other | Admitting: Occupational Therapy

## 2017-08-13 ENCOUNTER — Ambulatory Visit: Payer: Medicaid Other | Admitting: Physical Therapy

## 2017-08-18 ENCOUNTER — Encounter: Payer: Self-pay | Admitting: Physical Therapy

## 2017-08-18 ENCOUNTER — Ambulatory Visit: Payer: Medicaid Other | Attending: Family Medicine | Admitting: Physical Therapy

## 2017-08-18 DIAGNOSIS — R4789 Other speech disturbances: Secondary | ICD-10-CM | POA: Diagnosis present

## 2017-08-18 DIAGNOSIS — M6281 Muscle weakness (generalized): Secondary | ICD-10-CM | POA: Diagnosis present

## 2017-08-18 DIAGNOSIS — R278 Other lack of coordination: Secondary | ICD-10-CM | POA: Diagnosis present

## 2017-08-18 NOTE — Therapy (Addendum)
[facility] [address] [place], [place], [address] Phone: [phone]   Fax:  [phone]  Physical Therapy Evaluation  Patient Details  Name: Crystal CaffeyDenita Villegas MRN: [id number] Date of Birth: Aug 31, 1986 Referring Provider: Jeannie DonePOLANCO, LEONARD F   Encounter Date: 08/18/2017  PT End of Session - 08/18/17 1328    Visit Number  1    Number of Visits  17    Date for PT Re-Evaluation  10/06/17    Authorization Type  medicaid    PT Start Time  0115    PT Stop Time  0200    PT Time Calculation (min)  45 min    Equipment Utilized During Treatment  Gait belt    Activity Tolerance  Patient tolerated treatment well    Behavior During Therapy  [facility] for tasks assessed/performed       Past Medical History:  Diagnosis Date  . Asthma   . Hypertension   . Scoliosis   . Seizures (HCC)     Past Surgical History:  Procedure Laterality Date  . BACK SURGERY    . TUBAL LIGATION      There were no vitals filed for this visit.   Subjective Assessment - 08/18/17 1319    Subjective  Patient reports that she is having 3/10 pain to right leg. she says that she is not walking as well as she used to.     Pertinent History  Patient was in the hospital Aug 30-31 and patient thinks that she had a cva, this is not reported in her chart. She has a history of seizures. . Patients MRI of the brain and C-spine were normal. She continues to have some weakness in her lower extremities and reports difficulty walking intermediate and long distances.      How long can you stand comfortably?  10 mins per patient    How long can you walk comfortably?  10 mins per patient    Diagnostic tests  MRI    Patient Stated Goals  She wants to walk better.     Currently in Pain?  Yes    Pain Score  3     Pain Location  Leg    Pain Orientation  Right    Pain Descriptors / Indicators  Aching    Pain Type  Chronic pain    Pain Radiating Towards  right hip to right foot  and toes    Pain Onset  More than a month ago    Pain Frequency  Constant    Effect of Pain on Daily Activities  stops her from being active and she needs to rest more often         [facility] Assessment - 08/18/17 1324      Assessment   Medical Diagnosis  weakness    Referring Provider  Iantha FallenPOLANCO, LEONARD F    Onset Date/Surgical Date  06/24/17    Hand Dominance  Right    Next MD Visit  08/21/17    Prior Therapy  no      Precautions   Precautions  Fall      Restrictions   Weight Bearing Restrictions  No    Other Position/Activity Restrictions  no      Balance Screen   Has the patient fallen in the past 6 months  Yes    Has the patient had a decrease in activity level because of a fear of falling?   Yes  Is the patient reluctant to leave their home because of a fear of falling?   No      Home Environment   Living Environment  Private residence    Living Arrangements  Spouse/significant other;Children    Available Help at Discharge  Family    Type of Home  House    Home Layout  One level    Home Equipment  Walker - 2 wheels      Prior Function   Level of Independence  Independent with basic ADLs;Independent with household mobility without device    Vocation  Unemployed    Leisure  writing a book, music , draw      Cognition   Overall Cognitive Status  Within Functional Limits for tasks assessed       PAIN: right leg pain hip to foot 3/10 ranges to 10/10  POSTURE: WNL   PROM: WNL BLE  AROM:   STRENGTH:  Graded on a 0-5 scale Muscle Group Left Right                          Hip Flex 5/5 -3/5  Hip Abd 5/5 -3/5  Hip Add 5/5 -3/5  Hip Ext 5/5 -3/5  Hip IR/ER 5/5 4/5  Knee Flex 5/5 4/5  Knee Ext 5/5 4/5  Ankle DF 5/5 3/5  Ankle PF 5/5 5/5   SENSATION: tingling in RLE knee , numbness in RLE hip to foot   FUNCTIONAL MOBILITY: independent with transfers and all bed mobility    BALANCE:4 sec SLS RLE, 5 sec SLS LLE, independent tandem stand, tandem  walking indepnedent  Static Standing Balance  Normal Able to maintain standing balance against maximal resistance   Good Able to maintain standing balance against moderate resistance   Good-/Fair+ Able to maintain standing balance against minimal resistance x  Fair Able to stand unsupported without UE support and without LOB for 1-2 min   Fair- Requires Min A and UE support to maintain standing without loss of balance   Poor+ Requires mod A and UE support to maintain standing without loss of balance   Poor Requires max A and UE support to maintain standing balance without loss     GAIT:  OUTCOME MEASURES: TEST Outcome Interpretation  5 times sit<>stand 14.57sec >60 yo, >15 sec indicates increased risk for falls  10 meter walk test   .88              m/s <1.0 m/s indicates increased risk for falls; limited community ambulator  Timed up and Go  14.08               sec <14 sec indicates increased risk for falls  6 minute walk test      550        Feet 1000 feet is community ambulator                   Objective measurements completed on examination: See above findings.              PT Education - 08/18/17 1328    Education provided  Yes    Education Details  plan of care    Person(s) Educated  Patient    Methods  Explanation    Comprehension  Verbalized understanding       PT Short Term Goals - 08/18/17 1424      PT SHORT TERM GOAL #1   Title  Patient  will be independent in home exercise program to improve strength/mobility for better functional independence with ADLs.    Time  4    Period  Weeks    Status  New    Target Date  09/15/17      PT SHORT TERM GOAL #2   Title   Patient (< 55 years old) will complete five times sit to stand test in < 10 seconds indicating an increased LE strength and improved balance    Baseline  14.57 sec    Time  4    Period  Weeks    Status  New    Target Date  09/15/17        PT Long Term Goals - 08/18/17 1425       PT LONG TERM GOAL #1   Title  Patient will increase six minute walk test distance to >1000 for progression to community ambulator and improve gait ability    Baseline  550 ft    Time  8    Period  Weeks    Status  New    Target Date  10/13/17      PT LONG TERM GOAL #2   Title  Patient will increase 10 meter walk test to >1.56m/s as to improve gait speed for better community ambulation and to reduce fall risk.    Baseline  .88 m/sec    Time  8    Period  Weeks    Target Date  10/13/17      PT LONG TERM GOAL #3   Title  Patient will reduce timed up and go to <11 seconds to reduce fall risk and demonstrate improved transfer/gait ability    Baseline  14.08    Time  8    Period  Weeks    Status  New             Plan - 08/18/17 1412    History and Personal Factors relevant to plan of care:  Patient is 31 year old female who presents with reports of pain to right LE,  decreased strength to RLE hip and decreased gait speed. She reports difficulty with intermediate and long distance ambulation. She has decreased outcome measures including 6 MW test, 5 x sit to stand, TUG and 10 mw. She will beneift from skileed PT to imrpove strength of RLE and decrease her falls risk    Clinical Presentation  Evolving    Clinical Presentation due to:  seizures and a hx of falls    Clinical Decision Making  Moderate    Rehab Potential  Good    PT Frequency  2x / week    PT Duration  8 weeks    PT Treatment/Interventions  Balance training;Neuromuscular re-education;Therapeutic exercise;Therapeutic activities;Stair training;Manual techniques    PT Next Visit Plan  RLE strengtening , gait training, balance training    Consulted and Agree with Plan of Care  Patient       Patient will benefit from skilled therapeutic intervention in order to improve the following deficits and impairments:  Abnormal gait, Decreased balance, Difficulty walking, Impaired sensation, Pain, Decreased strength, Decreased  activity tolerance  Visit Diagnosis: Muscle weakness (generalized)     Problem List Patient Active Problem List   Diagnosis Date Noted  . HTN (hypertension) 06/21/2017  . Asthma 06/21/2017  . Muscle weakness 06/21/2017  . Seizure (HCC) 06/12/2017    Ezekiel Ina, PT DPT 08/18/2017, 3:06 PM  [facility] MAIN  [facility] SERVICES [address] [place], [place], [address] Phone: [phone]   Fax:  [phone]  Name: Crystal Villegas MRN: [id number] Date of Birth: 1985/12/09

## 2017-08-21 ENCOUNTER — Ambulatory Visit: Payer: Medicaid Other | Admitting: Occupational Therapy

## 2017-08-21 ENCOUNTER — Ambulatory Visit: Payer: Medicaid Other | Admitting: Physical Therapy

## 2017-08-21 ENCOUNTER — Ambulatory Visit: Payer: Medicaid Other | Admitting: Speech Pathology

## 2017-08-21 ENCOUNTER — Encounter: Payer: Self-pay | Admitting: Occupational Therapy

## 2017-08-21 DIAGNOSIS — R278 Other lack of coordination: Secondary | ICD-10-CM

## 2017-08-21 DIAGNOSIS — M6281 Muscle weakness (generalized): Secondary | ICD-10-CM | POA: Diagnosis not present

## 2017-08-21 DIAGNOSIS — R4789 Other speech disturbances: Secondary | ICD-10-CM

## 2017-08-21 NOTE — Therapy (Signed)
[facility] [address] [place], [place], [address] Phone: [phone]   Fax:  [phone]  Occupational Therapy Evaluation  Patient Details  Name: Crystal CaffeyDenita Villegas MRN: [id number] Date of Birth: 30-Apr-1986 Referring Provider: Iantha FallenPolanco, Leonard   Encounter Date: 08/21/2017  OT End of Session - 08/21/17 1538    Visit Number  1    Number of Visits  5    Date for OT Re-Evaluation  10/02/17    Authorization Type  Medicaid    OT Start Time  1000    OT Stop Time  1050    OT Time Calculation (min)  50 min    Activity Tolerance  Patient tolerated treatment well    Behavior During Therapy  [facility] for tasks assessed/performed       Past Medical History:  Diagnosis Date  . Asthma   . Hypertension   . Scoliosis   . Seizures (HCC)     Past Surgical History:  Procedure Laterality Date  . BACK SURGERY    . TUBAL LIGATION      There were no vitals filed for this visit.  Subjective Assessment - 08/21/17 1008    Pertinent History  Patient reports she had a seizure and thinks she had a light stroke.  Reports she was walking to the bathroom and passed out and hit her head on the wall.  EMS was called and [facility].  No therapies at home.     Patient Stated Goals  Patient would like to be independent, return to work.      Currently in Pain?  No/denies    Pain Score  0-No pain        [facility] OT Assessment - 08/21/17 1011      Assessment   Diagnosis  weakness    Referring Provider  Iantha FallenPolanco, Leonard    Onset Date  06/22/17    Prior Therapy  OT, PT       Precautions   Precautions  Fall      Restrictions   Weight Bearing Restrictions  No    Other Position/Activity Restrictions  no      Balance Screen   Has the patient fallen in the past 6 months  Yes    How many times?  once    Has the patient had a decrease in activity level because of a fear of falling?   No    Is the patient reluctant  to leave their home because of a fear of falling?   No      Home  Environment   Family/patient expects to be discharged to:  Private residence    Living Arrangements  Spouse/significant other    Available Help at Discharge  Family    Type of Home  Aartment    Home Access  Level entry    Home Layout  One level    Bathroom Shower/Tub  Tub/Shower unit;Curtain    Engineer, maintenance (IT)Bathroom Toilet  Standard    Home Equipment  Walker - 2 wheels    Lives With  Spouse;Family      Prior Function   Level of Independence  Independent    Vocation  Unemployed    Leisure  watch football, spend time with family, eat      ADL   Eating/Feeding  Modified independent    Grooming  Modified independent    Upper Body Bathing  Modified independent    Lower Body  Bathing  Modified independent    Upper Body Dressing  Independent    Lower Body Dressing  Modified independent    Toilet Transfer  Modified independent    Toileting - Clothing Manipulation  Modified independent    Tub/Shower Transfer  Modified independent    ADL comments  Patient reports she has difficlty with lifting heavy pots and pans, opening jars and containers and completing higher level IADL tasks.       IADL   Prior Level of Function Shopping  independent    Shopping  Shops independently for small purchases    Prior Level of Function Light Housekeeping  independent    Light Housekeeping  Maintains house alone or with occasional assistance    Prior Level of Function Meal Prep  independent    Meal Prep  Able to complete simple warm meal prep    Community Mobility  Drives own vehicle    Prior Level of Function Meal Prep  independent    Medication Management  Is responsible for taking medication in correct dosages at correct time    Prior Level of Function Museum/gallery curatorinancial Management  independent    Financial Management  Manages financial matters independently (budgets, writes checks, pays rent, bills goes to bank), collects and keeps track of income       Mobility   Mobility Status  Independent      Written Expression   Dominant Hand  Right      Vision - History   Baseline Vision  Wears glasses all the time      Sensation   Light Touch  Appears Intact    Stereognosis  Appears Intact    Hot/Cold  Appears Intact    Proprioception  Appears Intact      Coordination   Gross Motor Movements are Fluid and Coordinated  Yes    Fine Motor Movements are Fluid and Coordinated  No    9 Hole Peg Test  Right;Left    Right 9 Hole Peg Test  33 secs    Left 9 Hole Peg Test  26 secs    Coordination  Mildly impaired finger to nose and rapid alternating movements.       ROM / Strength   AROM / PROM / Strength  AROM;Strength      AROM   Overall AROM   Within functional limits for tasks performed;Other (comment) R shoulder flexion to 120 degrees.     Overall AROM Comments  Full opposition of digits.      Strength   Overall Strength  Deficits    Overall Strength Comments  decreased grip strength on right 30# versus left of 52#.  Overall strength 4/5      Hand Function   Right Hand Grip (lbs)  30    Right Hand Lateral Pinch  5 lbs    Right Hand 3 Point Pinch  4 lbs    Left Hand Grip (lbs)  52    Left Hand Lateral Pinch  11 lbs    Left 3 point pinch  9 lbs         Patient instructed on resistive putty exercises with red resistance for right hand for grip strength, lateral pinch, 3 point and 2 point pinch for home exercise program, to be completed 1-2 times a day for 10 repetitions each. Patient able to demonstrate exercises in the clinic.             OT Education - 08/21/17 1537  Education provided  Yes    Education Details  plan of care, goals, theraputty exercises for home program    Person(s) Educated  Patient;Spouse    Methods  Explanation;Demonstration;Verbal cues    Comprehension  Verbal cues required;Returned demonstration;Verbalized understanding;Need further instruction          OT Long Term Goals - 08/21/17  1549      OT LONG TERM GOAL #1   Title  Patient will be independent with home exercise program for stregnth and coordination for her right UE to improve independence in daily tasks.     Baseline  no current program    Time  4    Period  Weeks    Status  New    Target Date  09/25/17      OT LONG TERM GOAL #2   Title  Patient will complete meal preparation and clean up with modified independence.     Baseline  unable manage larger pots and pans/casserole dishes.    Time  4    Period  Weeks    Status  New    Target Date  09/25/17      OT LONG TERM GOAL #3   Title  Patient will increase right grip strength by 10# to be able to open jars and containers independently.    Baseline  difficulty with opening jars and containers at eval, especially new ones.     Time  4    Period  Weeks    Status  New    Target Date  09/25/17            Plan - 08/21/17 1541    Clinical Impression Statement  Patient is a 31 yo female who was referred to OT for muscle weakness.  She was previously hospitalized from 06-22-17 to 06-27-17 for muscle weakness to rule out CVA, MRI and testing were negative.  Patient was evaluated this date by OT and demonstrates muscle weakness in her right UE, with decreased active range of motion, decreased coordination and decreased strength affecting her ability to perform higher level IADL tasks at home.  She would like to be able to return to a job, although she was unemployed at the time of her hospitalization.  She has difficulty with opening jars and containers, lifting heavy items such as pots and pans and is unable to drive.  Patient would benefit from skilled OT to maximize her safety and independence in daily tasks at home and in the community.      Occupational Profile and client history currently impacting functional performance  Patient has a 38 yo daughter to care for, currently unemployed, relies on others for transportation    Occupational performance deficits  (Please refer to evaluation for details):  ADL's;IADL's;Work;Leisure    Rehab Potential  Good    Current Impairments/barriers affecting progress:  positive: motivation, family support.  Negative: difficulty at times with expressive aphasia    OT Frequency  1x / week    OT Duration  4 weeks    OT Treatment/Interventions  Self-care/ADL training;Moist Heat;DME and/or AE instruction;Patient/family education;Therapeutic exercises;Therapeutic exercise;Therapeutic activities;Neuromuscular education;Manual Therapy    Clinical Decision Making  Limited treatment options, no task modification necessary    Consulted and Agree with Plan of Care  Patient       Patient will benefit from skilled therapeutic intervention in order to improve the following deficits and impairments:  Decreased cognition, Decreased coordination, Decreased range of motion, Decreased strength, Impaired UE functional use  Visit Diagnosis: Muscle weakness (generalized)  Other lack of coordination    Problem List Patient Active Problem List   Diagnosis Date Noted  . HTN (hypertension) 06/21/2017  . Asthma 06/21/2017  . Muscle weakness 06/21/2017  . Seizure [facility]) 06/12/2017   Crystal Villegas T Arne Cleveland, OTR/L, CLT  Crystal Villegas 08/21/2017, 3:53 PM  [facility] MAIN [facility] SERVICES [address] [place], [place], [address] Phone: [phone]   Fax:  [phone]  Name: Crystal Villegas MRN: [id number] Date of Birth: 06/17/1986

## 2017-08-22 ENCOUNTER — Other Ambulatory Visit: Payer: Self-pay

## 2017-08-22 ENCOUNTER — Encounter: Payer: Self-pay | Admitting: Speech Pathology

## 2017-08-22 NOTE — Therapy (Signed)
[place] [facility] MAIN [facility] SERVICES [address] [place], [place], [address] Phone: [phone]   Fax:  [phone]  Speech Language Pathology Evaluation  Patient Details  Name: Judythe Postema MRN: [id number] Date of Birth: 1986-06-06 Referring Provider: 06/27/2017   Encounter Date: 08/21/2017  End of Session - 08/22/17 1539    Visit Number  1    Number of Visits  2    Date for SLP Re-Evaluation  09/05/17    SLP Start Time  0900    SLP Stop Time   0950    SLP Time Calculation (min)  50 min    Activity Tolerance  Patient tolerated treatment well       Past Medical History:  Diagnosis Date  . Asthma   . Hypertension   . Scoliosis   . Seizures (HCC)     Past Surgical History:  Procedure Laterality Date  . BACK SURGERY    . TUBAL LIGATION      There were no vitals filed for this visit.      SLP Evaluation [facility] - 08/22/17 0001      SLP Visit Information   SLP Received On  08/21/17    Referring Provider  06/27/2017    Onset Date  [facility], [facility]     Medical Diagnosis  Dysarthria      Subjective   Subjective  : "Trouble with getting words out- I know what I want to say but I can't get the word to come up"    Patient/Family Stated Goal  Normal speech      Prior Functional Status   Cognitive/Linguistic Baseline  Within functional limits      Auditory Comprehension   Overall Auditory Comprehension  Appears within functional limits for tasks assessed      Reading Comprehension   Reading Status  Within funtional limits      Expression   Primary Mode of Expression  Verbal      Verbal Expression   Overall Verbal Expression  Impaired    Other Verbal Expression Comments  variable delays in word finding      Written Expression   Written Expression  Within Functional Limits      Oral Motor/Sensory Function   Overall Oral Motor/Sensory Function  Impaired    Mandible  Impaired    Overall Oral Motor/Sensory Function  Excess  tension      Motor Speech   Overall Motor Speech  Impaired dysfluency    Respiration  Within functional limits    Phonation  Low vocal intensity    Resonance  Hypernasality    Articulation  Impaired    Level of Impairment  Conversation    Intelligibility  Intelligible    Phonation  Impaired    Tension Present  Jaw;Neck    Volume  Soft    Pitch  Appropriate                      SLP Education - 08/22/17 1538    Education provided  Yes    Education Details  plan of care    Person(s) Educated  Patient;Spouse    Methods  Explanation    Comprehension  Verbalized understanding         SLP Long Term Goals - 08/22/17 1541      SLP LONG TERM GOAL #1   Title  Patient will independently complete speech home exercise program.    Baseline  0%  Time  1    Period  Weeks    Status  New    Target Date  09/05/17       Plan - 08/22/17 1540    Clinical Impression Statement  This 31 year old woman with sudden onset of slurred speech is presenting with mild speech disturbance characterized by slowed speech, low volume speech, dysfluency (initial sound prolongation), word finding difficulties, and inconsistent imprecise articulation.  Overall, the patient demonstrates functional language and intelligible speech.  The patient will benefit from 1 SLP session to train home exercise program.    Speech Therapy Frequency  1x /week    Duration  1 week    Treatment/Interventions  SLP instruction and feedback;Patient/family education;Other (comment) Speech home exercise program    Potential to Achieve Goals  Good    Potential Considerations  Ability to learn/carryover information;Co-morbidities;Cooperation/participation level;Medical prognosis;Previous level of function;Severity of impairments;Family/community support    SLP Home Exercise Plan  To be determined    Consulted and Agree with Plan of Care  Patient       Patient will benefit from skilled therapeutic intervention in  order to improve the following deficits and impairments:   Other speech disturbance - Plan: SLP plan of care cert/re-cert    Problem List Patient Active Problem List   Diagnosis Date Noted  . HTN (hypertension) 06/21/2017  . Asthma 06/21/2017  . Muscle weakness 06/21/2017  . Seizure (HCC) 06/12/2017   Dollene PrimroseSusan G Tavita Eastham, MS/CCC- SLP  Leandrew KoyanagiAbernathy, Susie 08/22/2017, 3:48 PM  [place] [facility] MAIN [facility] [address] [place], [place], [address] Phone: [phone]   Fax:  [phone]  Name: Charlette CaffeyDenita White MRN: [id number] Date of Birth: 1986/09/08

## 2017-08-27 ENCOUNTER — Ambulatory Visit: Payer: Medicaid Other | Admitting: Occupational Therapy

## 2017-08-27 ENCOUNTER — Ambulatory Visit: Payer: Medicaid Other | Admitting: Speech Pathology

## 2017-08-27 ENCOUNTER — Ambulatory Visit: Payer: Medicaid Other | Admitting: Physical Therapy

## 2017-09-01 ENCOUNTER — Ambulatory Visit: Payer: Medicaid Other | Admitting: Speech Pathology

## 2017-09-01 ENCOUNTER — Encounter: Payer: Self-pay | Admitting: Occupational Therapy

## 2017-09-01 ENCOUNTER — Ambulatory Visit: Payer: Medicaid Other | Admitting: Physical Therapy

## 2017-09-01 ENCOUNTER — Encounter: Payer: Self-pay | Admitting: Physical Therapy

## 2017-09-01 ENCOUNTER — Ambulatory Visit: Payer: Medicaid Other | Admitting: Occupational Therapy

## 2017-09-01 DIAGNOSIS — M6281 Muscle weakness (generalized): Secondary | ICD-10-CM

## 2017-09-01 DIAGNOSIS — R4789 Other speech disturbances: Secondary | ICD-10-CM

## 2017-09-01 DIAGNOSIS — R278 Other lack of coordination: Secondary | ICD-10-CM

## 2017-09-01 NOTE — Therapy (Signed)
[place] [facility] [address] [place], [place], [address] Phone: [phone]   Fax:  [phone]  Physical Therapy Treatment  Patient Details  Name: Crystal Villegas MRN: [id number] Date of Birth: 12-Feb-1986 Referring Provider: Jeannie DonePOLANCO, LEONARD F   Encounter Date: 09/01/2017  PT End of Session - 09/01/17 0953    Visit Number  2    Number of Visits  17    Date for PT Re-Evaluation  10/06/17    Authorization Type  medicaid    PT Start Time  0945    PT Stop Time  1015    PT Time Calculation (min)  30 min    Equipment Utilized During Treatment  Gait belt    Activity Tolerance  Patient tolerated treatment well    Behavior During Therapy  [facility] for tasks assessed/performed       Past Medical History:  Diagnosis Date  . Asthma   . Hypertension   . Scoliosis   . Seizures (HCC)     Past Surgical History:  Procedure Laterality Date  . BACK SURGERY    . TUBAL LIGATION      There were no vitals filed for this visit.  Subjective Assessment - 09/01/17 0951    Subjective  Patient reports that she is having 5/10 pain to right leg. she says that she is not having any other problems.    Pertinent History  Patient was in the hospital Aug 30-31 and patient thinks that she had a cva, this is not reported in her chart. She has a history of seizures. . Patients MRI of the brain and C-spine were normal. She continues to have some weakness in her lower extremities and reports difficulty walking intermediate and long distances.     How long can you stand comfortably?  10 mins per patient    How long can you walk comfortably?  10 mins per patient    Diagnostic tests  MRI    Patient Stated Goals  She wants to walk better.     Currently in Pain?  Yes    Pain Score  5     Pain Location  Leg    Pain Orientation  Right    Pain Descriptors / Indicators  Aching    Pain Type  Chronic pain    Pain Radiating Towards  not radiating    Pain Onset   More than a month ago    Pain Frequency  Rarely    Aggravating Factors   movement    Pain Relieving Factors  nothing    Effect of Pain on Daily Activities  does not stop her from any activities at home    Multiple Pain Sites  No      Treatment: [employer] fitness x 5 mins L 1 Leg press x 20 x 3 with 60 ls Leg press heel raises  x 20 x 3 ; 60 lbs  Ascending / descending steps with railing x 4 steps x 3 repetitions TM without any set speed and patient propelling it with her feet x 2 mins No reports of increased pain. Patient verbalizing during the session that she does not like to do any of these exercises.                          PT Education - 09/01/17 0953    Education provided  Yes    Education Details  HEP    Person(s)  Educated  Patient    Methods  Explanation;Verbal cues    Comprehension  Verbalized understanding       PT Short Term Goals - 08/18/17 1424      PT SHORT TERM GOAL #1   Title  Patient will be independent in home exercise program to improve strength/mobility for better functional independence with ADLs.    Time  4    Period  Weeks    Status  New    Target Date  09/15/17      PT SHORT TERM GOAL #2   Title   Patient (< 30 years old) will complete five times sit to stand test in < 10 seconds indicating an increased LE strength and improved balance    Baseline  14.57 sec    Time  4    Period  Weeks    Status  New    Target Date  09/15/17        PT Long Term Goals - 08/18/17 1425      PT LONG TERM GOAL #1   Title  Patient will increase six minute walk test distance to >1000 for progression to community ambulator and improve gait ability    Baseline  550 ft    Time  8    Period  Weeks    Status  New    Target Date  10/13/17      PT LONG TERM GOAL #2   Title  Patient will increase 10 meter walk test to >1.20m/s as to improve gait speed for better community ambulation and to reduce fall risk.    Baseline  .88 m/sec    Time  8     Period  Weeks    Target Date  10/13/17      PT LONG TERM GOAL #3   Title  Patient will reduce timed up and go to <11 seconds to reduce fall risk and demonstrate improved transfer/gait ability    Baseline  14.08    Time  8    Period  Weeks    Status  New            Plan - 09/01/17 0954    Clinical Impression Statement  Patient presents with reports of 5/10 right leg pain. she performs strengthening exercises without any pain behaviors and  needs cues for technique and intensity. She talks the entire session rambling about her shoes and does not stay on task with her exercises. She is highly distractable during session. She will continue to benefit from skilled PT to improve mobiity and quality of life.    Rehab Potential  Good    PT Frequency  2x / week    PT Duration  8 weeks    PT Treatment/Interventions  Balance training;Neuromuscular re-education;Therapeutic exercise;Therapeutic activities;Stair training;Manual techniques    PT Next Visit Plan  RLE strengtening , gait training, balance training    Consulted and Agree with Plan of Care  Patient       Patient will benefit from skilled therapeutic intervention in order to improve the following deficits and impairments:  Abnormal gait, Decreased balance, Difficulty walking, Impaired sensation, Pain, Decreased strength, Decreased activity tolerance  Visit Diagnosis: Muscle weakness (generalized)  Other lack of coordination     Problem List Patient Active Problem List   Diagnosis Date Noted  . HTN (hypertension) 06/21/2017  . Asthma 06/21/2017  . Muscle weakness 06/21/2017  . Seizure (HCC) 06/12/2017    Ezekiel Ina, PT DPT 09/01/2017, 10:07  AM  [place] [facility] MAIN [facility] [address] [place], [place], [address] Phone: [phone]   Fax:  [phone]  Name: Crystal Villegas MRN: [id number] Date of Birth: 1986-09-21

## 2017-09-02 ENCOUNTER — Other Ambulatory Visit: Payer: Self-pay

## 2017-09-02 ENCOUNTER — Encounter: Payer: Self-pay | Admitting: Speech Pathology

## 2017-09-02 NOTE — Therapy (Signed)
[place] MAIN [facility] SERVICES [address] [place], [place], [address] Phone: [phone]   Fax:  [phone]  Speech Language Pathology Treatment/Discharge Summary  Patient Details  Name: Crystal Villegas MRN: [id number] Date of Birth: 04-Feb-1986 Referring Provider: 06/27/2017   Encounter Date: 09/01/2017  End of Session - 09/02/17 1325    Visit Number  2    Number of Visits  2    Date for SLP Re-Evaluation  09/05/17    SLP Start Time  1100    SLP Stop Time   1140    SLP Time Calculation (min)  40 min    Activity Tolerance  Patient tolerated treatment well       Past Medical History:  Diagnosis Date  . Asthma   . Hypertension   . Scoliosis   . Seizures ([place])     Past Surgical History:  Procedure Laterality Date  . BACK SURGERY    . TUBAL LIGATION      There were no vitals filed for this visit.  Subjective Assessment - 09/02/17 1324    Subjective  Patient is much more relaxed and pleased witht the app she found to practice speech    Patient is accompained by:  Family member    Currently in Pain?  No/denies            ADULT SLP TREATMENT - 09/02/17 0001      General Information   Behavior/Cognition  Alert;Cooperative;Pleasant mood    HPI  31 year old woman with sudden onset of slurred speech       Treatment Provided   Treatment provided  Cognitive-Linquistic      Pain Assessment   Pain Assessment  No/denies pain      Cognitive-Linquistic Treatment   Treatment focused on  Dysarthria;Voice;Patient/family/caregiver education    Skilled Treatment  HOME EXERCISE PROGRAM:  The patient has an app that allows her to speak/record and compare her production to accurate production.  She has found this motivating and demonstrates improved speech intelligibility, vocal loudness, fluency, and rate of speech.  She was provided with stretches to alleviate hyperfunction of speech and voice musculature, word retrieval strategies, and  games/activities that promote word retrieval.        Assessment / Recommendations / Plan   Plan  Discharge SLP treatment due to (comment);All goals met      Progression Toward Goals   Progression toward goals  Goals met, education completed, patient discharged from [facility] - 09/02/17 1324    Education provided  Yes    Education Details  HEP    Person(s) Educated  Patient;Spouse    Methods  Explanation;Handout    Comprehension  Verbalized understanding         SLP Long Term Goals - 08/22/17 1541      SLP LONG TERM GOAL #1   Title  Patient will independently complete speech home exercise program.    Baseline  0%    Time  1    Period  Weeks    Status  New    Target Date  09/05/17       Plan - 09/02/17 1325    Clinical Impression Statement  The patient returns today with significantly improved speech intelligibility and fluency.  She is more relaxed than she was at the evaluation, and this is reducing the tension that was a primary contributor to her speech disturbance.  She was provided with stretches  to alleviate hyperfunction of speech and voice musculature, word retrieval strategies, and games/activities that promote word retrieval.      Speech Therapy Frequency  Other (comment) Discharge    Potential to Achieve Goals  Good    Potential Considerations  Ability to learn/carryover information;Co-morbidities;Cooperation/participation level;Medical prognosis;Previous level of function;Severity of impairments;Family/community support    SLP Home Exercise Plan  stretches, word retrieval strategies, games/activities that promote word retrieval, and speech app    Consulted and Agree with Plan of Care  Patient;Family member/caregiver       Patient will benefit from skilled therapeutic intervention in order to improve the following deficits and impairments:   Other speech disturbance    Problem List Patient Active Problem List   Diagnosis Date Noted  . HTN  (hypertension) 06/21/2017  . Asthma 06/21/2017  . Muscle weakness 06/21/2017  . Seizure ([place]) 06/12/2017   Leroy Sea, [place], Susie 09/02/2017, 1:27 PM  [place] MAIN [facility] SERVICES [address] [place], [place], [address] Phone: [phone]   Fax:  [phone]   Name: Crystal Villegas MRN: [id number] Date of Birth: 07-01-1986

## 2017-09-02 NOTE — Therapy (Signed)
[place] [facility] [facility] [address] [place], [place], [address] Phone: [phone]   Fax:  [phone]  Occupational Therapy Treatment  Patient Details  Name: Crystal CaffeyDenita Villegas MRN: [id number] Date of Birth: 10-20-1985 Referring Provider: Iantha FallenPolanco, Leonard   Encounter Date: 09/01/2017  OT End of Session - 09/01/17 1030    Visit Number  2    Number of Visits  5    Date for OT Re-Evaluation  10/02/17    Authorization Type  Medicaid    OT Start Time  1020    Activity Tolerance  Patient tolerated treatment well    Behavior During Therapy  [facility] for tasks assessed/performed       Past Medical History:  Diagnosis Date  . Asthma   . Hypertension   . Scoliosis   . Seizures (HCC)     Past Surgical History:  Procedure Laterality Date  . BACK SURGERY    . TUBAL LIGATION      There were no vitals filed for this visit.  Subjective Assessment - 09/01/17 1025    Subjective   Patient reports she has been working on exercises for her right hand at home with the putty.  No increased pain from exercises.      Pertinent History  Patient reports she had a seizure and thinks she had a light stroke.  Reports she was walking to the bathroom and passed out and hit her head on the wall.  EMS was called and [facility].  No therapies at home.     Patient Stated Goals  Patient would like to be independent, return to work.      Currently in Pain?  Yes    Pain Score  5     Pain Location  Arm    Pain Orientation  Right    Pain Descriptors / Indicators  Aching    Pain Onset  More than a month ago    Pain Frequency  Intermittent    Multiple Pain Sites  No                   OT Treatments/Exercises (OP) - 09/02/17 1652      Fine Motor Coordination   Other Fine Motor Exercises  Patient seen for coordination skills with manipulation of 100 pegboard pieces with focus on turning, flipping and placing into grid  with cues for speed and technique.  Patient performing knotting/unknotting with medium nylon rope for multiple repetitions with use of bilateral UEs for tasks completion.  Cues to use right hand as the lead for the task.       Neurological Re-education Exercises   Other Exercises 1  Patient seen for sustained Grip strength on 2nd setting with resistance of 11.2# for 25 repetitions completed 1 set, followed by one set of 17.9# for 25 repetitions with cues for technique and hand position on gripper.  Patient seen for strengthening exercises with 3# dowel for shoulder flexion, chest press, ABD/ADD, forwards and backwards circles, elbow flexion/extension with cues for form and technique.  Completed 2 sets of 12 repetitions each.                    OT Long Term Goals - 08/21/17 1549      OT LONG TERM GOAL #1   Title  Patient will be independent with home exercise program for stregnth and coordination for her right UE to improve independence in daily  tasks.     Baseline  no current program    Time  4    Period  Weeks    Status  New    Target Date  09/25/17      OT LONG TERM GOAL #2   Title  Patient will complete meal preparation and clean up with modified independence.     Baseline  unable manage larger pots and pans/casserole dishes.    Time  4    Period  Weeks    Status  New    Target Date  09/25/17      OT LONG TERM GOAL #3   Title  Patient will increase right grip strength by 10# to be able to open jars and containers independently.    Baseline  difficulty with opening jars and containers at eval, especially new ones.     Time  4    Period  Weeks    Status  New    Target Date  09/25/17            Plan - 09/01/17 1031    Clinical Impression Statement  Patient seen for focus this date on right UE strengthening of the hand and arm, coordination skills to manipulate objects and use of bilateral UEs for functional tasks.  She required cues for technique with most tasks,  dropping items occasionally during session but when focused on task she performed well.  Continue to work towards increasing independence in daily tasks at home and in the community.     Rehab Potential  Good    Current Impairments/barriers affecting progress:  positive: motivation, family support.  Negative: difficulty at times with expressive aphasia    OT Frequency  1x / week    OT Duration  4 weeks    OT Treatment/Interventions  Self-care/ADL training;Moist Heat;DME and/or AE instruction;Patient/family education;Therapeutic exercises;Therapeutic exercise;Therapeutic activities;Neuromuscular education;Manual Therapy    Consulted and Agree with Plan of Care  Patient       Patient will benefit from skilled therapeutic intervention in order to improve the following deficits and impairments:  Decreased cognition, Decreased coordination, Decreased range of motion, Decreased strength, Impaired UE functional use  Visit Diagnosis: Muscle weakness (generalized)  Other lack of coordination    Problem List Patient Active Problem List   Diagnosis Date Noted  . HTN (hypertension) 06/21/2017  . Asthma 06/21/2017  . Muscle weakness 06/21/2017  . Seizure [facility](HCC) 06/12/2017   [name], OTR/L, CLT  [name] 09/02/2017, 4:59 PM  [place] [facility] [address] [place], [place], [address] Phone: [phone]   Fax:  [phone]  Name: Crystal CaffeyDenita Villegas MRN: [id number] Date of Birth: [birth year]

## 2017-09-08 ENCOUNTER — Encounter: Payer: Medicaid Other | Admitting: Speech Pathology

## 2017-09-11 ENCOUNTER — Ambulatory Visit: Payer: Medicaid Other | Admitting: Occupational Therapy

## 2017-09-18 ENCOUNTER — Encounter: Payer: Medicaid Other | Admitting: Speech Pathology

## 2017-09-18 ENCOUNTER — Ambulatory Visit: Payer: Medicaid Other | Admitting: Physical Therapy

## 2017-09-18 ENCOUNTER — Ambulatory Visit: Payer: Medicaid Other | Admitting: Occupational Therapy

## 2017-09-25 ENCOUNTER — Ambulatory Visit: Payer: Medicaid Other | Attending: Family Medicine | Admitting: Occupational Therapy

## 2018-05-05 ENCOUNTER — Other Ambulatory Visit: Payer: Self-pay

## 2018-05-05 ENCOUNTER — Emergency Department
Admission: EM | Admit: 2018-05-05 | Discharge: 2018-05-06 | Disposition: A | Discharge status: Home / Self Care | Payer: Medicaid Other | Attending: Emergency Medicine | Admitting: Emergency Medicine

## 2018-05-05 DIAGNOSIS — Z87891 Personal history of nicotine dependence: Secondary | ICD-10-CM | POA: Insufficient documentation

## 2018-05-05 DIAGNOSIS — Z5320 Procedure and treatment not carried out because of patient's decision for unspecified reasons: Secondary | ICD-10-CM | POA: Diagnosis not present

## 2018-05-05 DIAGNOSIS — Z79899 Other long term (current) drug therapy: Secondary | ICD-10-CM | POA: Insufficient documentation

## 2018-05-05 DIAGNOSIS — I10 Essential (primary) hypertension: Secondary | ICD-10-CM | POA: Insufficient documentation

## 2018-05-05 DIAGNOSIS — J45909 Unspecified asthma, uncomplicated: Secondary | ICD-10-CM | POA: Diagnosis not present

## 2018-05-05 DIAGNOSIS — R531 Weakness: Secondary | ICD-10-CM | POA: Diagnosis not present

## 2018-05-05 DIAGNOSIS — R51 Headache: Secondary | ICD-10-CM | POA: Insufficient documentation

## 2018-05-05 DIAGNOSIS — Z7982 Long term (current) use of aspirin: Secondary | ICD-10-CM | POA: Insufficient documentation

## 2018-05-05 DIAGNOSIS — G8929 Other chronic pain: Secondary | ICD-10-CM

## 2018-05-05 NOTE — ED Notes (Signed)
ED Provider at bedside. 

## 2018-05-05 NOTE — ED Triage Notes (Signed)
Pt  In with co right sided headache for a year since she had stroke. States has been here and to her pmd for the same. States she recently cut her hair and also noted "lumps on her scalp" unsure of how long they have been there.

## 2018-05-05 NOTE — ED Notes (Signed)
Pt is showing bumps on her head to Dr Manson PasseyBrown

## 2018-05-06 NOTE — ED Notes (Signed)
Patient seen at end of hallway exiting to lobby. Patient did not announce leaving at time of exiting room.

## 2018-05-06 NOTE — ED Provider Notes (Signed)
[facility] Emergency Department Provider Note    First MD Initiated Contact with Patient 05/05/18 2345     (approximate)  I have reviewed the triage vital signs and the nursing notes.   HISTORY  Chief Complaint Headache    HPI Crystal Villegas is a 32 y.o. female with below list of chronic medical conditions Presents to the emergency department with chronic right-sided headaches x1 year since the patient states that she had a stroke.  On my arrival to the room the patient informed me that she has had right-sided scalp discomfort since cutting her hair approximately 3 weeks ago.  Patient states that the area is tender to the touch.  Patient denies any fever.  After I evaluated the patient the patient states I have been having this headache for over a year" ya'll have done nothing for me".  Patient then stated that she had right upper extremity weakness in conjunction with the headache.  Past Medical History:  Diagnosis Date  . Asthma   . Hypertension   . Scoliosis   . Seizures [facility](HCC)     Patient Active Problem List   Diagnosis Date Noted  . HTN (hypertension) 06/21/2017  . Asthma 06/21/2017  . Muscle weakness 06/21/2017  . Seizure (HCC) 06/12/2017    Past Surgical History:  Procedure Laterality Date  . BACK SURGERY    . TUBAL LIGATION      Prior to Admission medications   Medication Sig Start Date End Date Taking? Authorizing Provider  ADVAIR DISKUS 250-50 MCG/DOSE AEPB Inhale 1 puff into the lungs 2 (two) times daily. 06/24/17   Milagros LollSudini, Srikar, MD  albuterol (PROVENTIL) (2.5 MG/3ML) 0.083% nebulizer solution Inhale 3 mLs into the lungs every 6 (six) hours as needed for wheezing or shortness of breath. 06/24/17   Milagros LollSudini, Srikar, MD  amoxicillin-clavulanate (AUGMENTIN) 875-125 MG tablet Take 1 tablet by mouth every 12 (twelve) hours. 06/13/17   Shaune Pollackhen, Qing, MD  aspirin EC 81 MG tablet Take 81 mg by mouth daily.    [provider]    docusate sodium (COLACE) 100 MG capsule Take 100 mg by mouth 2 (two) times daily.    [provider]  ibuprofen (ADVIL,MOTRIN) 800 MG tablet Take 1 tablet (800 mg total) by mouth every 8 (eight) hours as needed. 04/23/16   Rockne MenghiniNorman, Anne-Caroline, MD  lamoTRIgine (LAMICTAL) 25 MG tablet Take 1 tablet (25 mg total) by mouth 2 (two) times daily. Patient taking differently: Take 25 mg by mouth every morning.  06/13/17   Shaune Pollackhen, Qing, MD  lamoTRIgine (LAMICTAL) 25 MG tablet Take 50 mg by mouth at bedtime.    [provider]  Multiple Vitamins-Calcium (ONE-A-DAY WOMENS FORMULA) TABS Take 1 tablet by mouth daily.    [provider]    Allergies No Known Drug Allergies  Family History  Family history unknown: Yes    Social History Social History   Tobacco Use  . Smoking status: Former Smoker    Last attempt to quit: 05/28/2017    Years since quitting: 0.9  . Smokeless tobacco: Former NeurosurgeonUser    Quit date: 05/28/2017  Substance Use Topics  . Alcohol use: No  . Drug use: No    Types: Cocaine    Review of Systems Constitutional: No fever/chills Eyes: No visual changes. ENT: No sore throat. Cardiovascular: Denies chest pain. Respiratory: Denies shortness of breath. Gastrointestinal: No abdominal pain.  No nausea, no vomiting.  No diarrhea.  No constipation. Genitourinary: Negative for  dysuria. Musculoskeletal: Negative for neck pain.  Negative for back pain. Integumentary: Negative for rash. Neurological: Positive for headaches, negative for focal weakness or numbness.   ____________________________________________   PHYSICAL EXAM:  VITAL SIGNS: ED Triage Vitals  Enc Vitals Group     BP 05/05/18 2009 123/75     Pulse Rate 05/05/18 2009 78     Resp 05/05/18 2009 19     Temp 05/05/18 2009 98.4 F (36.9 C)     Temp Source 05/05/18 2009 Oral     SpO2 05/05/18 2009 100 %     Weight 05/05/18 2010 56.7 kg (125 lb)     Height 05/05/18 2010 1.524 m (5')      Head Circumference --      Peak Flow --      Pain Score 05/05/18 2014 6     Pain Loc --      Pain Edu? --      Excl. in GC? --     Constitutional: Alert and oriented. Well appearing and in no acute distress. Eyes: Conjunctivae are normal. PERRL. EOMI. Head: Atraumatic. Mouth/Throat: Mucous membranes are moist.  Oropharynx non-erythematous. Neck: No stridor.  No meningeal signs.  Cardiovascular: Normal rate, regular rhythm. Good peripheral circulation. Grossly normal heart sounds. Respiratory: Normal respiratory effort.  No retractions. Lungs CTAB. Gastrointestinal: Soft and nontender. No distention.  Musculoskeletal: No lower extremity tenderness nor edema. No gross deformities of extremities. Neurologic:  Normal speech and language. No gross focal neurologic deficits are appreciated.  Skin:  Skin is warm, dry and intact. No rash noted. Psychiatric: Mood and affect are normal. Speech and behavior are normal.   _________________________________________ Procedures   ____________________________________________   INITIAL IMPRESSION / ASSESSMENT AND PLAN / ED COURSE  As part of my medical decision making, I reviewed the following data within the electronic MEDICAL RECORD NUMBER  32 year old female presenting with above-stated history and physical exam secondary to above-stated history and physical exam.  After the patient informing of her right arm weakness I informed the patient that imaging should be performed.  The patient however is very irate from the moment I went into the room.  I tried to de-escalate the situation with the patient and inform her that I am more than willing to help her however patient left AGAINST MEDICAL ADVICE with for any additional testing was performed.____________________________________________  FINAL CLINICAL IMPRESSION(S) / ED DIAGNOSES  Final diagnoses:  Chronic nonintractable headache, unspecified headache type     MEDICATIONS GIVEN DURING THIS  VISIT:  Medications - No data to display   ED Discharge Orders    None       Note:  This document was prepared using Dragon voice recognition software and may include unintentional dictation errors.    Darci Current, MD 05/06/18 [phone]

## 2018-05-22 ENCOUNTER — Other Ambulatory Visit: Payer: Self-pay | Admitting: Internal Medicine

## 2018-05-22 DIAGNOSIS — R0602 Shortness of breath: Secondary | ICD-10-CM

## 2018-05-22 DIAGNOSIS — I208 Other forms of angina pectoris: Secondary | ICD-10-CM

## 2018-05-29 ENCOUNTER — Ambulatory Visit: Payer: Medicaid Other

## 2018-07-14 ENCOUNTER — Emergency Department
Admission: EM | Admit: 2018-07-14 | Discharge: 2018-07-14 | Disposition: A | Discharge status: Home / Self Care | Payer: Medicaid Other | Attending: Emergency Medicine | Admitting: Emergency Medicine

## 2018-07-14 ENCOUNTER — Other Ambulatory Visit: Payer: Self-pay

## 2018-07-14 DIAGNOSIS — R251 Tremor, unspecified: Secondary | ICD-10-CM | POA: Insufficient documentation

## 2018-07-14 DIAGNOSIS — Z5321 Procedure and treatment not carried out due to patient leaving prior to being seen by health care provider: Secondary | ICD-10-CM | POA: Insufficient documentation

## 2018-07-14 DIAGNOSIS — S59911A Unspecified injury of right forearm, initial encounter: Secondary | ICD-10-CM | POA: Insufficient documentation

## 2018-07-14 DIAGNOSIS — Y939 Activity, unspecified: Secondary | ICD-10-CM | POA: Diagnosis not present

## 2018-07-14 DIAGNOSIS — Y999 Unspecified external cause status: Secondary | ICD-10-CM | POA: Diagnosis not present

## 2018-07-14 DIAGNOSIS — Y92410 Unspecified street and highway as the place of occurrence of the external cause: Secondary | ICD-10-CM | POA: Diagnosis not present

## 2018-07-14 DIAGNOSIS — M7918 Myalgia, other site: Secondary | ICD-10-CM | POA: Diagnosis not present
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
## 2019-01-19 ENCOUNTER — Encounter: Payer: Self-pay | Admitting: Emergency Medicine

## 2019-01-19 ENCOUNTER — Other Ambulatory Visit: Payer: Self-pay

## 2019-01-19 DIAGNOSIS — Z87891 Personal history of nicotine dependence: Secondary | ICD-10-CM | POA: Insufficient documentation

## 2019-01-19 DIAGNOSIS — Y92810 Car as the place of occurrence of the external cause: Secondary | ICD-10-CM | POA: Diagnosis not present

## 2019-01-19 DIAGNOSIS — I10 Essential (primary) hypertension: Secondary | ICD-10-CM | POA: Insufficient documentation

## 2019-01-19 DIAGNOSIS — Y9389 Activity, other specified: Secondary | ICD-10-CM | POA: Diagnosis not present

## 2019-01-19 DIAGNOSIS — Z7982 Long term (current) use of aspirin: Secondary | ICD-10-CM | POA: Diagnosis not present

## 2019-01-19 DIAGNOSIS — S40021A Contusion of right upper arm, initial encounter: Secondary | ICD-10-CM | POA: Insufficient documentation

## 2019-01-19 DIAGNOSIS — J45909 Unspecified asthma, uncomplicated: Secondary | ICD-10-CM | POA: Diagnosis not present

## 2019-01-19 DIAGNOSIS — W231XXA Caught, crushed, jammed, or pinched between stationary objects, initial encounter: Secondary | ICD-10-CM | POA: Diagnosis not present

## 2019-01-19 DIAGNOSIS — Y998 Other external cause status: Secondary | ICD-10-CM | POA: Diagnosis not present

## 2019-01-19 DIAGNOSIS — Z79899 Other long term (current) drug therapy: Secondary | ICD-10-CM | POA: Insufficient documentation

## 2019-01-19 DIAGNOSIS — S4991XA Unspecified injury of right shoulder and upper arm, initial encounter: Secondary | ICD-10-CM | POA: Insufficient documentation
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
## 2019-01-20 ENCOUNTER — Emergency Department: Payer: Medicaid Other

## 2019-01-20 ENCOUNTER — Emergency Department
Admission: EM | Admit: 2019-01-20 | Discharge: 2019-01-20 | Disposition: A | Discharge status: Home / Self Care | Payer: Medicaid Other | Attending: Emergency Medicine | Admitting: Emergency Medicine

## 2019-01-20 DIAGNOSIS — S4991XA Unspecified injury of right shoulder and upper arm, initial encounter: Secondary | ICD-10-CM

## 2019-01-20 DIAGNOSIS — S40021A Contusion of right upper arm, initial encounter: Secondary | ICD-10-CM

## 2019-01-20 MED ORDER — KETOROLAC TROMETHAMINE 30 MG/ML IJ SOLN
30.0000 mg | Freq: Once | INTRAMUSCULAR | Status: AC
  Administered 2019-01-20: 02:00:00 30 mg via INTRAMUSCULAR
  Filled 2019-01-20: qty 1

## 2019-01-20 MED ORDER — KETOROLAC TROMETHAMINE 10 MG PO TABS: 10.0000 mg | ORAL_TABLET | Freq: Four times a day (QID) | ORAL | 0 refills | Status: DC | PRN

## 2019-01-20 NOTE — ED Provider Notes (Signed)
[facility] Emergency Department Provider Note   First MD Initiated Contact with Patient 01/20/19 0210     (approximate)  I have reviewed the triage vital signs and the nursing notes.   HISTORY  Chief Complaint Arm Injury    HPI Crystal Villegas is a 33 y.o. female with medical history as listed below presents to the emergency department with 10 out of 10 right arm pain.  Patient states that her arm was accidentally slammed in a car door 2 nights ago and that she has had persistent pain since then.  Patient states that the pain extends from her shoulder/chest to her fingers.  Patient denies any neck pain.  Patient states that she did not take anything for discomfort at home.  Patient states that pain of the arm is worse with any movement of the arm.        Past Medical History:  Diagnosis Date  . Asthma   . Hypertension   . Scoliosis   . Seizures [facility])     Patient Active Problem List   Diagnosis Date Noted  . HTN (hypertension) 06/21/2017  . Asthma 06/21/2017  . Muscle weakness 06/21/2017  . Seizure (HCC) 06/12/2017    Past Surgical History:  Procedure Laterality Date  . BACK SURGERY    . TUBAL LIGATION      Prior to Admission medications   Medication Sig Start Date End Date Taking? Authorizing Provider  ADVAIR DISKUS 250-50 MCG/DOSE AEPB Inhale 1 puff into the lungs 2 (two) times daily. 06/24/17   Milagros Loll, MD  albuterol (PROVENTIL) (2.5 MG/3ML) 0.083% nebulizer solution Inhale 3 mLs into the lungs every 6 (six) hours as needed for wheezing or shortness of breath. 06/24/17   Milagros Loll, MD  amoxicillin-clavulanate (AUGMENTIN) 875-125 MG tablet Take 1 tablet by mouth every 12 (twelve) hours. 06/13/17   Shaune Pollack, MD  aspirin EC 81 MG tablet Take 81 mg by mouth daily.    [provider]  docusate sodium (COLACE) 100 MG capsule Take 100 mg by mouth 2 (two) times daily.    [provider]  ibuprofen (ADVIL,MOTRIN)  800 MG tablet Take 1 tablet (800 mg total) by mouth every 8 (eight) hours as needed. 04/23/16   Rockne Menghini, MD  ketorolac (TORADOL) 10 MG tablet Take 1 tablet (10 mg total) by mouth every 6 (six) hours as needed. 01/20/19   Darci Current, MD  lamoTRIgine (LAMICTAL) 25 MG tablet Take 1 tablet (25 mg total) by mouth 2 (two) times daily. Patient taking differently: Take 25 mg by mouth every morning.  06/13/17   Shaune Pollack, MD  lamoTRIgine (LAMICTAL) 25 MG tablet Take 50 mg by mouth at bedtime.    [provider]  Multiple Vitamins-Calcium (ONE-A-DAY WOMENS FORMULA) TABS Take 1 tablet by mouth daily.    [provider]    Allergies Patient has no known allergies.  Family History  Family history unknown: Yes    Social History Social History   Tobacco Use  . Smoking status: Former Smoker    Last attempt to quit: 05/28/2017    Years since quitting: 1.6  . Smokeless tobacco: Former Neurosurgeon    Quit date: 05/28/2017  Substance Use Topics  . Alcohol use: No  . Drug use: No    Types: Cocaine    Review of Systems Constitutional: No fever/chills Eyes: No visual changes. ENT: No sore throat. Cardiovascular: Denies chest pain. Respiratory: Denies shortness of breath. Gastrointestinal: No abdominal  pain.  No nausea, no vomiting.  No diarrhea.  No constipation. Genitourinary: Negative for dysuria. Musculoskeletal: Negative for neck pain.  Negative for back pain. Integumentary: Negative for rash. Neurological: Negative for headaches, focal weakness or numbness.   ____________________________________________   PHYSICAL EXAM:  VITAL SIGNS: ED Triage Vitals [01/19/19 2341]  Enc Vitals Group     BP 136/76     Pulse Rate 77     Resp 18     Temp 98 F (36.7 C)     Temp Source Oral     SpO2 100 %     Weight 54.4 kg (120 lb)     Height 1.499 m (4\' 11" )     Head Circumference      Peak Flow      Pain Score 10     Pain Loc      Pain Edu?      Excl. in GC?      Constitutional: Alert and oriented. Well appearing and in no acute distress. Eyes: Conjunctivae are normal. Mouth/Throat: Mucous membranes are moist.  Oropharynx non-erythematous. Neck: No stridor.  No pain with palpation.   Cardiovascular: Normal rate, regular rhythm. Good peripheral circulation. Grossly normal heart sounds. Respiratory: Normal respiratory effort.  No retractions. Lungs CTAB. Gastrointestinal: Soft and nontender. No distention.  Musculoskeletal: Generalized tenderness of the right arm with very gentle palpation from the elbow to fingers.  Pain with active and passive range of motion of the shoulder the elbow the wrist and as such exam limited secondary to discomfort. Neurologic:  Normal speech and language. No gross focal neurologic deficits are appreciated.  Skin:  Skin is warm, dry and intact. No rash noted.   __________________________________________  RADIOLOGY I, Darci CurrentANDOLPH N Cheskel Silverio, personally viewed and evaluated these images (plain radiographs) as part of my medical decision making, as well as reviewing the written report by the radiologist.  ED MD interpretation: X-ray of the chest right forearm and right humerus all negative per radiologist.  Official radiology report(s): Dg Chest 1 View  Result Date: 01/20/2019 CLINICAL DATA:  Initial evaluation for acute right-sided chest pain, recent trauma. EXAM: CHEST  1 VIEW COMPARISON:  Prior radiograph from 07/05/2015. FINDINGS: Cardiac and mediastinal silhouettes are stable in size and contour, and remain within normal limits. Lungs normally inflated. No focal infiltrates. No edema or effusion. No pneumothorax. No acute osseous finding. Thoracolumbar scoliosis with bilateral Harrington rod in place. IMPRESSION: No active cardiopulmonary disease. Electronically Signed   By: Rise MuBenjamin  McClintock M.D.   On: 01/20/2019 03:29   Dg Forearm Right  Result Date: 01/20/2019 CLINICAL DATA:  Initial evaluation for acute pain status  post recent trauma. EXAM: RIGHT FOREARM - 2 VIEW COMPARISON:  None. FINDINGS: There is no evidence of fracture or other focal bone lesions. Soft tissues are unremarkable. IMPRESSION: Negative. Electronically Signed   By: Rise MuBenjamin  McClintock M.D.   On: 01/20/2019 03:27   Dg Humerus Right  Result Date: 01/20/2019 CLINICAL DATA:  Right arm pain after injury. Upper arm got slammed in car door. EXAM: RIGHT HUMERUS - 2+ VIEW COMPARISON:  None. FINDINGS: Cortical margins of the humerus are intact. There is no evidence of fracture or other focal bone lesions. Shoulder and elbow alignment are maintained. Soft tissues are unremarkable. IMPRESSION: Negative radiographs of the right humerus. Electronically Signed   By: Narda RutherfordMelanie  Sanford M.D.   On: 01/20/2019 00:38    Procedures   ____________________________________________   INITIAL IMPRESSION / MDM / ASSESSMENT AND PLAN / ED  COURSE  As part of my medical decision making, I reviewed the following data within the electronic MEDICAL RECORD NUMBER  32 year old female presenting with above-stated history and physical exam secondary to right arm pain.  X-rays of the right humerus forearm and chest all negative.  Patient given Toradol IM shot will be prescribed p.o. Toradol for home.  Patient given a sling for comfort and advised not to keep her arm persistently in the sling to avoid frozen shoulder.  Patient referred to orthopedic surgery.  Edina Shary Villegas was evaluated in Emergency Department on 01/20/2019 for the symptoms described in the history of present illness. She was evaluated in the context of the global COVID-19 pandemic, which necessitated consideration that the patient might be at risk for infection with the SARS-CoV-2 virus that causes COVID-19. Institutional protocols and algorithms that pertain to the evaluation of patients at risk for COVID-19 are in a state of rapid change based on information released by regulatory bodies including the CDC and  federal and state organizations. These policies and algorithms were followed during the patient's care in the ED.       ____________________________________________  FINAL CLINICAL IMPRESSION(S) / ED DIAGNOSES  Final diagnoses:  Injury of right upper extremity, initial encounter  Arm contusion, right, initial encounter     MEDICATIONS GIVEN DURING THIS VISIT:  Medications  ketorolac (TORADOL) 30 MG/ML injection 30 mg (30 mg Intramuscular Given 01/20/19 0222)     ED Discharge Orders         Ordered    ketorolac (TORADOL) 10 MG tablet  Every 6 hours PRN     01/20/19 0336           Note:  This document was prepared using Dragon voice recognition software and may include unintentional dictation errors.   Darci Current, MD 01/20/19 [phone]

## 2019-01-20 NOTE — ED Notes (Signed)
Pt able to wiggle R hand's fingers; states unable to move the rest of the R arm d/t pain; states 10/10 pain; calmly laying in bed; pulse 1+ R wrist; cap refill< 3 sec; O2 monitor attached to finger on R hand; 100% sat.

## 2019-01-20 NOTE — ED Notes (Addendum)
Pt requesting pain meds. Denies any other needs. Notified EDP Manson Passey.

## 2019-02-03 ENCOUNTER — Encounter: Payer: Self-pay | Admitting: Emergency Medicine

## 2019-02-03 ENCOUNTER — Emergency Department
Admission: EM | Admit: 2019-02-03 | Discharge: 2019-02-03 | Disposition: A | Discharge status: Home / Self Care | Payer: Medicaid Other | Attending: Emergency Medicine | Admitting: Emergency Medicine

## 2019-02-03 ENCOUNTER — Other Ambulatory Visit: Payer: Self-pay

## 2019-02-03 DIAGNOSIS — F1490 Cocaine use, unspecified, uncomplicated: Secondary | ICD-10-CM | POA: Insufficient documentation

## 2019-02-03 DIAGNOSIS — R251 Tremor, unspecified: Secondary | ICD-10-CM | POA: Diagnosis present

## 2019-02-03 DIAGNOSIS — J45909 Unspecified asthma, uncomplicated: Secondary | ICD-10-CM | POA: Insufficient documentation

## 2019-02-03 DIAGNOSIS — Z87891 Personal history of nicotine dependence: Secondary | ICD-10-CM | POA: Diagnosis not present

## 2019-02-03 HISTORY — DX: Other psychoactive substance abuse, uncomplicated: F19.10

## 2019-02-03 HISTORY — DX: Dissociative and conversion disorder, unspecified: F44.9

## 2019-02-03 LAB — URINALYSIS, ROUTINE W REFLEX MICROSCOPIC
Bacteria, UA: NONE SEEN
Bilirubin Urine: NEGATIVE
Glucose, UA: NEGATIVE mg/dL
Hgb urine dipstick: NEGATIVE
Ketones, ur: 20 mg/dL — AB
Nitrite: NEGATIVE
Protein, ur: 30 mg/dL — AB
Specific Gravity, Urine: 1.028 (ref 1.005–1.030)
pH: 5 (ref 5.0–8.0)

## 2019-02-03 LAB — URINE DRUG SCREEN, QUALITATIVE (ARMC ONLY)
Amphetamines, Ur Screen: NOT DETECTED
Barbiturates, Ur Screen: NOT DETECTED
Benzodiazepine, Ur Scrn: NOT DETECTED
Cannabinoid 50 Ng, Ur ~~LOC~~: NOT DETECTED
Cocaine Metabolite,Ur ~~LOC~~: POSITIVE — AB
MDMA (Ecstasy)Ur Screen: NOT DETECTED
Methadone Scn, Ur: NOT DETECTED
Opiate, Ur Screen: NOT DETECTED
Phencyclidine (PCP) Ur S: NOT DETECTED
Tricyclic, Ur Screen: NOT DETECTED

## 2019-02-03 LAB — POCT PREGNANCY, URINE: Preg Test, Ur: NEGATIVE

## 2019-02-03 NOTE — Progress Notes (Signed)
CSW and RNCM spoke with pt to assess her current needs. Pt stated that she was not out of her medications, they were just at the hotel. Pt reported that she wants to find her husband first. Pt stated that she currently plans to go find her husband. When asked if there was anything she would need from social work, pt denied. Pt shared that her daughter is at a safe location with her friend in [place], [place].  Pt was encouraged to ask nurse to use phone if she wanted to try to contact her husband.   TOC signing off.    Murvin Donning  [facility] ED  [phone]

## 2019-02-03 NOTE — Discharge Instructions (Addendum)
Please return as needed.
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
## 2019-02-04 LAB — URINE CULTURE: Special Requests: NORMAL

## 2019-05-04 ENCOUNTER — Inpatient Hospital Stay
Admission: EM | Admit: 2019-05-04 | Discharge: 2019-05-05 | DRG: 193 | Disposition: A | Discharge status: Home / Self Care | Payer: Medicaid Other | Source: Other Acute Inpatient Hospital | Attending: Internal Medicine | Admitting: Internal Medicine

## 2019-05-04 ENCOUNTER — Other Ambulatory Visit: Payer: Self-pay

## 2019-05-04 ENCOUNTER — Emergency Department: Payer: Medicaid Other

## 2019-05-04 DIAGNOSIS — M419 Scoliosis, unspecified: Secondary | ICD-10-CM | POA: Diagnosis present

## 2019-05-04 DIAGNOSIS — Z9851 Tubal ligation status: Secondary | ICD-10-CM

## 2019-05-04 DIAGNOSIS — Z7951 Long term (current) use of inhaled steroids: Secondary | ICD-10-CM

## 2019-05-04 DIAGNOSIS — J4541 Moderate persistent asthma with (acute) exacerbation: Secondary | ICD-10-CM

## 2019-05-04 DIAGNOSIS — R0602 Shortness of breath: Secondary | ICD-10-CM

## 2019-05-04 DIAGNOSIS — Z87891 Personal history of nicotine dependence: Secondary | ICD-10-CM

## 2019-05-04 DIAGNOSIS — Z79899 Other long term (current) drug therapy: Secondary | ICD-10-CM

## 2019-05-04 DIAGNOSIS — Z20828 Contact with and (suspected) exposure to other viral communicable diseases: Secondary | ICD-10-CM | POA: Diagnosis present

## 2019-05-04 DIAGNOSIS — Z7982 Long term (current) use of aspirin: Secondary | ICD-10-CM | POA: Diagnosis not present

## 2019-05-04 DIAGNOSIS — Z791 Long term (current) use of non-steroidal anti-inflammatories (NSAID): Secondary | ICD-10-CM | POA: Diagnosis not present

## 2019-05-04 DIAGNOSIS — J9601 Acute respiratory failure with hypoxia: Secondary | ICD-10-CM | POA: Diagnosis present

## 2019-05-04 DIAGNOSIS — I10 Essential (primary) hypertension: Secondary | ICD-10-CM | POA: Diagnosis present

## 2019-05-04 DIAGNOSIS — J189 Pneumonia, unspecified organism: Secondary | ICD-10-CM | POA: Diagnosis not present

## 2019-05-04 DIAGNOSIS — F449 Dissociative and conversion disorder, unspecified: Secondary | ICD-10-CM | POA: Diagnosis present

## 2019-05-04 DIAGNOSIS — J45901 Unspecified asthma with (acute) exacerbation: Secondary | ICD-10-CM | POA: Diagnosis present

## 2019-05-04 DIAGNOSIS — R569 Unspecified convulsions: Secondary | ICD-10-CM | POA: Diagnosis present

## 2019-05-04 LAB — COMPREHENSIVE METABOLIC PANEL
ALT: 27 U/L (ref 0–44)
AST: 33 U/L (ref 15–41)
Albumin: 4.1 g/dL (ref 3.5–5.0)
Alkaline Phosphatase: 42 U/L (ref 38–126)
Anion gap: 9 (ref 5–15)
BUN: 6 mg/dL (ref 6–20)
CO2: 25 mmol/L (ref 22–32)
Calcium: 9.1 mg/dL (ref 8.9–10.3)
Chloride: 106 mmol/L (ref 98–111)
Creatinine, Ser: 0.83 mg/dL (ref 0.44–1.00)
GFR calc Af Amer: >60 mL/min (ref >60)
GFR calc non Af Amer: >60 mL/min (ref >60)
Glucose, Bld: 88 mg/dL (ref 70–99)
Potassium: 3.6 mmol/L (ref 3.5–5.1)
Sodium: 140 mmol/L (ref 135–145)
Total Bilirubin: 0.3 mg/dL (ref 0.3–1.2)
Total Protein: 7.1 g/dL (ref 6.5–8.1)

## 2019-05-04 LAB — CBC
HCT: 31.8 % — ABNORMAL LOW (ref 36.0–46.0)
Hemoglobin: 9.8 g/dL — ABNORMAL LOW (ref 12.0–15.0)
MCH: 27.4 pg (ref 26.0–34.0)
MCHC: 30.8 g/dL (ref 30.0–36.0)
MCV: 88.8 fL (ref 80.0–100.0)
Platelets: 313 10*3/uL (ref 150–400)
RBC: 3.58 MIL/uL — ABNORMAL LOW (ref 3.87–5.11)
RDW: 15.5 % (ref 11.5–15.5)
WBC: 5 10*3/uL (ref 4.0–10.5)
nRBC: 0 % (ref 0.0–0.2)

## 2019-05-04 LAB — TROPONIN I (HIGH SENSITIVITY)
Troponin I (High Sensitivity): <2 ng/L (ref <18)
Troponin I (High Sensitivity): <2 ng/L (ref <18)

## 2019-05-04 LAB — SARS CORONAVIRUS 2 BY RT PCR (HOSPITAL ORDER, PERFORMED IN ~~LOC~~ HOSPITAL LAB): SARS Coronavirus 2: NEGATIVE

## 2019-05-04 MED ORDER — IPRATROPIUM-ALBUTEROL 0.5-2.5 (3) MG/3ML IN SOLN
3.0000 mL | Freq: Four times a day (QID) | RESPIRATORY_TRACT | Status: DC
  Administered 2019-05-04 – 2019-05-05 (×3): 3 mL via RESPIRATORY_TRACT
  Filled 2019-05-04 (×4): qty 3

## 2019-05-04 MED ORDER — SENNOSIDES-DOCUSATE SODIUM 8.6-50 MG PO TABS: 1.0000 | ORAL_TABLET | Freq: Every evening | ORAL | Status: DC | PRN

## 2019-05-04 MED ORDER — SODIUM CHLORIDE 0.9 % IV SOLN
1.0000 g | INTRAVENOUS | Status: DC
  Filled 2019-05-04 (×2): qty 10

## 2019-05-04 MED ORDER — ONDANSETRON HCL 4 MG PO TABS: 4.0000 mg | ORAL_TABLET | Freq: Four times a day (QID) | ORAL | Status: DC | PRN

## 2019-05-04 MED ORDER — METHYLPREDNISOLONE SODIUM SUCC 125 MG IJ SOLR
60.0000 mg | Freq: Four times a day (QID) | INTRAMUSCULAR | Status: DC
  Administered 2019-05-04 – 2019-05-05 (×3): 60 mg via INTRAVENOUS
  Filled 2019-05-04 (×3): qty 2

## 2019-05-04 MED ORDER — ACETAMINOPHEN 650 MG RE SUPP: 650.0000 mg | Freq: Four times a day (QID) | RECTAL | Status: DC | PRN

## 2019-05-04 MED ORDER — ENOXAPARIN SODIUM 40 MG/0.4ML ~~LOC~~ SOLN: 40.0000 mg | SUBCUTANEOUS | Status: DC

## 2019-05-04 MED ORDER — LAMOTRIGINE 25 MG PO TABS
50.0000 mg | ORAL_TABLET | Freq: Every day | ORAL | Status: DC
  Administered 2019-05-04: 22:00:00 50 mg via ORAL
  Filled 2019-05-04: qty 2

## 2019-05-04 MED ORDER — SODIUM CHLORIDE 0.9% FLUSH: 3.0000 mL | INTRAVENOUS | Status: DC | PRN

## 2019-05-04 MED ORDER — ONDANSETRON HCL 4 MG/2ML IJ SOLN: 4.0000 mg | Freq: Four times a day (QID) | INTRAMUSCULAR | Status: DC | PRN

## 2019-05-04 MED ORDER — IPRATROPIUM-ALBUTEROL 0.5-2.5 (3) MG/3ML IN SOLN
3.0000 mL | Freq: Once | RESPIRATORY_TRACT | Status: AC
  Administered 2019-05-04: 3 mL via RESPIRATORY_TRACT
  Filled 2019-05-04: qty 3

## 2019-05-04 MED ORDER — SODIUM CHLORIDE 0.9 % IV SOLN
500.0000 mg | INTRAVENOUS | Status: DC
  Filled 2019-05-04 (×2): qty 500

## 2019-05-04 MED ORDER — ASPIRIN EC 81 MG PO TBEC
81.0000 mg | DELAYED_RELEASE_TABLET | Freq: Every day | ORAL | Status: DC
  Administered 2019-05-05: 09:00:00 81 mg via ORAL
  Filled 2019-05-04: qty 1

## 2019-05-04 MED ORDER — SODIUM CHLORIDE 0.9% FLUSH
3.0000 mL | Freq: Two times a day (BID) | INTRAVENOUS | Status: DC
  Administered 2019-05-04 – 2019-05-05 (×2): 3 mL via INTRAVENOUS

## 2019-05-04 MED ORDER — SODIUM CHLORIDE 0.9 % IV SOLN
1.0000 g | Freq: Once | INTRAVENOUS | Status: AC
  Administered 2019-05-04: 1 g via INTRAVENOUS
  Filled 2019-05-04: qty 10

## 2019-05-04 MED ORDER — SODIUM CHLORIDE 0.9 % IV SOLN
250.0000 mL | INTRAVENOUS | Status: DC | PRN
  Administered 2019-05-05: 03:00:00 250 mL via INTRAVENOUS

## 2019-05-04 MED ORDER — HYDROCODONE-ACETAMINOPHEN 5-325 MG PO TABS: 1.0000 | ORAL_TABLET | ORAL | Status: DC | PRN

## 2019-05-04 MED ORDER — ADULT MULTIVITAMIN W/MINERALS CH
1.0000 | ORAL_TABLET | Freq: Every day | ORAL | Status: DC
  Administered 2019-05-05: 09:00:00 1 via ORAL
  Filled 2019-05-04: qty 1

## 2019-05-04 MED ORDER — SODIUM CHLORIDE 0.9 % IV SOLN
500.0000 mg | Freq: Once | INTRAVENOUS | Status: AC
  Administered 2019-05-04: 15:00:00 500 mg via INTRAVENOUS
  Filled 2019-05-04: qty 500

## 2019-05-04 MED ORDER — ALBUTEROL SULFATE (2.5 MG/3ML) 0.083% IN NEBU: 2.5000 mg | INHALATION_SOLUTION | RESPIRATORY_TRACT | Status: DC | PRN

## 2019-05-04 MED ORDER — ACETAMINOPHEN 325 MG PO TABS: 650.0000 mg | ORAL_TABLET | Freq: Four times a day (QID) | ORAL | Status: DC | PRN

## 2019-05-04 MED ORDER — IPRATROPIUM-ALBUTEROL 0.5-2.5 (3) MG/3ML IN SOLN
3.0000 mL | Freq: Once | RESPIRATORY_TRACT | Status: AC
  Administered 2019-05-04: 15:00:00 3 mL via RESPIRATORY_TRACT
  Filled 2019-05-04: qty 3

## 2019-05-04 NOTE — ED Notes (Addendum)
Husband Vicente Serene updated on plan of care and pt's status with pt's permission

## 2019-05-04 NOTE — ED Notes (Signed)
Report given to 1C RN 

## 2019-05-04 NOTE — ED Triage Notes (Signed)
Pt arrives via [facility] from urgent care. Pt reports SOB x 1 week with worseing symptoms today, reports cough and chest pain but no fever. She is prescribed an inhaler but has not been using it. Pt winded while answering questions but in NAD. A&Ox4. Pt received a duoneb and 125mg  solumedrol at urgent care

## 2019-05-04 NOTE — Plan of Care (Signed)

## 2019-05-04 NOTE — ED Notes (Signed)
ED TO INPATIENT HANDOFF REPORT  ED Nurse Name and Phone #: Shanda BumpsJessica [phone]   S Name/Age/Gender Crystal Villegas 33 y.o. female Room/Bed: ED11A/ED11A  Code Status   Code Status: Prior  Home/SNF/Other Home Patient oriented to: self, place, time and situation Is this baseline? Yes   Triage Complete: Triage complete  Chief Complaint sob  Triage Note Pt arrives via [facility] from urgent care. Pt reports SOB x 1 week with worseing symptoms today, reports cough and chest pain but no fever. She is prescribed an inhaler but has not been using it. Pt winded while answering questions but in NAD. A&Ox4. Pt received a duoneb and 125mg  solumedrol at urgent care   Allergies No Known Allergies  Level of Care/Admitting Diagnosis ED Disposition    ED Disposition Condition Comment   Admit  [facility] [[id number]]  Level of Care: Med-Surg [16]  Covid Evaluation: Confirmed COVID Negative  Diagnosis: Asthma exacerbation [409811][[id number]]  Admitting Physician: Ihor AustinYREDDY, PAVAN [[id number]]  Attending Physician: Ihor AustinPYREDDY, PAVAN [[id number]]  Estimated length of stay: past midnight tomorrow  Certification:: I certify this patient will need inpatient services for at least 2 midnights  PT Class (Do Not Modify): Inpatient [101]  PT Acc Code (Do Not Modify): Private [1]       B Medical/Surgery History Past Medical History:  Diagnosis Date  . Asthma   . Conversion disorder 2018   hospitalized in 2018 for neurological complaints, two different neurologists felt symptoms were likely conversion disorder  . Hypertension   . Polysubstance abuse (HCC)    Unclear whether this is an ongoing issue, but hx of multiple using multiple illicit substances  . Scoliosis   . Seizures (HCC)    Past Surgical History:  Procedure Laterality Date  . BACK SURGERY    . TUBAL LIGATION       A IV Location/Drains/Wounds Patient Lines/Drains/Airways Status   Active Line/Drains/Airways    Name:    Placement date:   Placement time:   Site:   Days:   Peripheral IV 05/04/19 Right Antecubital   05/04/19    1322    Antecubital   less than 1          Intake/Output Last 24 hours No intake or output data in the 24 hours ending 05/04/19 1859  Labs/Imaging Results for orders placed or performed during the hospital encounter of 05/04/19 (from the past 48 hour(s))  CBC     Status: Abnormal   Collection Time: 05/04/19  1:00 PM  Result Value Ref Range   WBC 5.0 4.0 - 10.5 K/uL   RBC 3.58 (L) 3.87 - 5.11 MIL/uL   Hemoglobin 9.8 (L) 12.0 - 15.0 g/dL   HCT 08.631.8 (L) 57.836.0 - 46.946.0 %   MCV 88.8 80.0 - 100.0 fL   MCH 27.4 26.0 - 34.0 pg   MCHC 30.8 30.0 - 36.0 g/dL   RDW 62.915.5 52.811.5 - 41.315.5 %   Platelets 313 150 - 400 K/uL   nRBC 0.0 0.0 - 0.2 %    Comment: Performed at [facility], [address] [place], [place] [address]  Comprehensive metabolic panel     Status: None   Collection Time: 05/04/19  1:00 PM  Result Value Ref Range   Sodium 140 135 - 145 mmol/L   Potassium 3.6 3.5 - 5.1 mmol/L   Chloride 106 98 - 111 mmol/L   CO2 25 22 - 32 mmol/L   Glucose, Bld 88 70 - 99 mg/dL  BUN 6 6 - 20 mg/dL   Creatinine, Ser 1.610.83 0.44 - 1.00 mg/dL   Calcium 9.1 8.9 - 09.610.3 mg/dL   Total Protein 7.1 6.5 - 8.1 g/dL   Albumin 4.1 3.5 - 5.0 g/dL   AST 33 15 - 41 U/L   ALT 27 0 - 44 U/L   Alkaline Phosphatase 42 38 - 126 U/L   Total Bilirubin 0.3 0.3 - 1.2 mg/dL   GFR calc non Af Amer >60 >60 mL/min   GFR calc Af Amer >60 >60 mL/min   Anion gap 9 5 - 15    Comment: Performed at [facility], [address] [place], [place] [address]  Troponin I (High Sensitivity)     Status: None   Collection Time: 05/04/19  1:00 PM  Result Value Ref Range   Troponin I (High Sensitivity) <2 <18 ng/L    Comment: (NOTE) Elevated high sensitivity troponin I (hsTnI) values and significant  changes across serial measurements may suggest ACS but many other  chronic and acute conditions are known  to elevate hsTnI results.  Refer to the "Links" section for chest pain algorithms and additional  guidance. Performed at [facility], [address] [place], [place] [address]   SARS Coronavirus 2 (CEPHEID - Performed in [facility] hospital lab), Hosp Order     Status: None   Collection Time: 05/04/19  1:21 PM   Specimen: Nasopharyngeal Swab  Result Value Ref Range   SARS Coronavirus 2 NEGATIVE NEGATIVE    Comment: (NOTE) If result is NEGATIVE SARS-CoV-2 target nucleic acids are NOT DETECTED. The SARS-CoV-2 RNA is generally detectable in upper and lower  respiratory specimens during the acute phase of infection. The lowest  concentration of SARS-CoV-2 viral copies this assay can detect is 250  copies / mL. A negative result does not preclude SARS-CoV-2 infection  and should not be used as the sole basis for treatment or other  patient management decisions.  A negative result may occur with  improper specimen collection / handling, submission of specimen other  than nasopharyngeal swab, presence of viral mutation(s) within the  areas targeted by this assay, and inadequate number of viral copies  (<250 copies / mL). A negative result must be combined with clinical  observations, patient history, and epidemiological information. If result is POSITIVE SARS-CoV-2 target nucleic acids are DETECTED. The SARS-CoV-2 RNA is generally detectable in upper and lower  respiratory specimens dur ing the acute phase of infection.  Positive  results are indicative of active infection with SARS-CoV-2.  Clinical  correlation with patient history and other diagnostic information is  necessary to determine patient infection status.  Positive results do  not rule out bacterial infection or co-infection with other viruses. If result is PRESUMPTIVE POSTIVE SARS-CoV-2 nucleic acids MAY BE PRESENT.   A presumptive positive result was obtained on the submitted specimen  and confirmed on repeat  testing.  While 2019 novel coronavirus  (SARS-CoV-2) nucleic acids may be present in the submitted sample  additional confirmatory testing may be necessary for epidemiological  and / or clinical management purposes  to differentiate between  SARS-CoV-2 and other Sarbecovirus currently known to infect humans.  If clinically indicated additional testing with an alternate test  methodology [phone]([id number]) is advised. The SARS-CoV-2 RNA is generally  detectable in upper and lower respiratory sp ecimens during the acute  phase of infection. The expected result is Negative. Fact Sheet for Patients:  BoilerBrush.com.cyhttps://www.fda.gov/media/136312/download Fact Sheet for Healthcare Providers:  https://pope.com/https://www.fda.gov/media/136313/download This test is not yet approved or cleared by the [place] FDA and has been authorized for detection and/or diagnosis of SARS-CoV-2 by FDA under an Emergency Use Authorization (EUA).  This EUA will remain in effect (meaning this test can be used) for the duration of the COVID-19 declaration under Section 564(b)(1) of the Act, 21 U.S.C. section 360bbb-3(b)(1), unless the authorization is terminated or revoked sooner. Performed at [facility], [address] [place], [place] [address]    Dg Chest Portable 1 View  Result Date: 05/04/2019 CLINICAL DATA:  Shortness of breath over the last week. Symptoms worsening today. Cough and chest pain. EXAM: PORTABLE CHEST 1 VIEW COMPARISON:  07/05/2015.  01/20/2019 FINDINGS: Heart size is normal. The patient has taken a poor inspiration or there is some pulmonary volume loss. Question patchy bilateral pulmonary infiltrates. No dense consolidation, lobar collapse or effusion. Chronic scoliosis surgery. IMPRESSION: Diminished lung volumes. Patchy bilateral infiltrates. Most suspicious for hazy pneumonia. Viral pneumonia could have this appearance. Some of these findings could be due to a poor inspiratory effort. Electronically Signed   By:  Paulina FusiMark  Shogry M.D.   On: 05/04/2019 13:46    Pending Labs Unresulted Labs (From admission, onward)    Start     Ordered   05/04/19 1441  Blood culture (routine x 2)  BLOOD CULTURE X 2,   STAT     05/04/19 1440   Signed and Held  HIV antibody (Routine Testing)  Once,   R     Signed and Held   Signed and Held  CBC  (enoxaparin (LOVENOX)    CrCl >/= 30 ml/min)  Once,   R    Comments: Baseline for enoxaparin therapy IF NOT ALREADY DRAWN.  Notify MD if PLT < 100 K.    Signed and Held   Signed and Held  Creatinine, serum  (enoxaparin (LOVENOX)    CrCl >/= 30 ml/min)  Once,   R    Comments: Baseline for enoxaparin therapy IF NOT ALREADY DRAWN.    Signed and Held   Signed and Held  Creatinine, serum  (enoxaparin (LOVENOX)    CrCl >/= 30 ml/min)  Weekly,   R    Comments: while on enoxaparin therapy    Signed and Held   Signed and Held  Basic metabolic panel  Tomorrow morning,   R     Signed and Held   Signed and Held  CBC  Tomorrow morning,   R     Signed and Held          Vitals/Pain Today's Vitals   05/04/19 1630 05/04/19 1645 05/04/19 1700 05/04/19 1715  BP: 115/80  (!) 133/98   Pulse: 71 89 99 81  Resp: (!) 21 16 (!) 21 20  Temp:      TempSrc:      SpO2: 100% 100% 100% 100%  PainSc:        Isolation Precautions No active isolations  Medications Medications  ipratropium-albuterol (DUONEB) 0.5-2.5 (3) MG/3ML nebulizer solution 3 mL (has no administration in time range)  albuterol (PROVENTIL) (2.5 MG/3ML) 0.083% nebulizer solution 2.5 mg (has no administration in time range)  methylPREDNISolone sodium succinate (SOLU-MEDROL) 125 mg/2 mL injection 60 mg (has no administration in time range)  cefTRIAXone (ROCEPHIN) 1 g in sodium chloride 0.9 % 100 mL IVPB (has no administration in time range)  azithromycin (ZITHROMAX) 500 mg in sodium chloride 0.9 % 250 mL IVPB (has no administration in time range)  ipratropium-albuterol (  DUONEB) 0.5-2.5 (3) MG/3ML nebulizer solution 3 mL (3  mLs Nebulization Given 05/04/19 1504)  ipratropium-albuterol (DUONEB) 0.5-2.5 (3) MG/3ML nebulizer solution 3 mL (3 mLs Nebulization Given 05/04/19 1504)  ipratropium-albuterol (DUONEB) 0.5-2.5 (3) MG/3ML nebulizer solution 3 mL (3 mLs Nebulization Given 05/04/19 1504)  cefTRIAXone (ROCEPHIN) 1 g in sodium chloride 0.9 % 100 mL IVPB (1 g Intravenous New Bag/Given 05/04/19 1453)  azithromycin (ZITHROMAX) 500 mg in sodium chloride 0.9 % 250 mL IVPB (500 mg Intravenous New Bag/Given 05/04/19 1458)    Mobility walks Low fall risk   Focused Assessments 1   R Recommendations: See Admitting Provider Note  Report given to:   Additional Notes:

## 2019-05-04 NOTE — H&P (Signed)
[facility] [place] at [facility]   PATIENT NAME: Crystal BurlyDenita White Villegas    MR#:  [id number]  DATE OF BIRTH:  1986-01-22  DATE OF ADMISSION:  05/04/2019  PRIMARY CARE PHYSICIAN: Thornton PapasSykes, Larry Justain, PA-C   REQUESTING/REFERRING PHYSICIAN:   CHIEF COMPLAINT:   Chief Complaint  Patient presents with  . Shortness of Breath    HISTORY OF PRESENT ILLNESS: Crystal Villegas  is a 33 y.o. female with a known history of substance abuse, scoliosis, seizure disorder, bronchial asthma presented to the emergency room for shortness of breath and wheezing.  Symptoms have been going on for the last 2 days.  Patient also has some cough dry in nature.  COVID-19 test is negative.  She was worked up with chest x-ray which showed pneumonia.  Patient started on antibiotics and breathing treatments.  Hospitalist service was consulted for further care.  No complaints of chest pain.  No recent travel.  PAST MEDICAL HISTORY:   Past Medical History:  Diagnosis Date  . Asthma   . Conversion disorder 2018   hospitalized in 2018 for neurological complaints, two different neurologists felt symptoms were likely conversion disorder  . Hypertension   . Polysubstance abuse (HCC)    Unclear whether this is an ongoing issue, but hx of multiple using multiple illicit substances  . Scoliosis   . Seizures (HCC)     PAST SURGICAL HISTORY:  Past Surgical History:  Procedure Laterality Date  . BACK SURGERY    . TUBAL LIGATION      SOCIAL HISTORY:  Social History   Tobacco Use  . Smoking status: Former Smoker    Quit date: 05/28/2017    Years since quitting: 1.9  . Smokeless tobacco: Former NeurosurgeonUser    Quit date: 05/28/2017  Substance Use Topics  . Alcohol use: No    FAMILY HISTORY:  Family History  Family history unknown: Yes    DRUG ALLERGIES: No Known Allergies  REVIEW OF SYSTEMS:   CONSTITUTIONAL: No fever, fatigue or weakness.  EYES: No blurred or double vision.   EARS, NOSE, AND THROAT: No tinnitus or ear pain.  RESPIRATORY: Has cough, shortness of breath, wheezing  no hemoptysis.  CARDIOVASCULAR: No chest pain, orthopnea, edema.  GASTROINTESTINAL: No nausea, vomiting, diarrhea or abdominal pain.  GENITOURINARY: No dysuria, hematuria.  ENDOCRINE: No polyuria, nocturia,  HEMATOLOGY: No anemia, easy bruising or bleeding SKIN: No rash or lesion. MUSCULOSKELETAL: No joint pain or arthritis.   NEUROLOGIC: No tingling, numbness, weakness.  PSYCHIATRY: No anxiety or depression.   MEDICATIONS AT HOME:  Prior to Admission medications   Medication Sig Start Date End Date Taking? Authorizing Provider  ADVAIR DISKUS 250-50 MCG/DOSE AEPB Inhale 1 puff into the lungs 2 (two) times daily. 06/24/17   Milagros LollSudini, Srikar, MD  albuterol (PROVENTIL) (2.5 MG/3ML) 0.083% nebulizer solution Inhale 3 mLs into the lungs every 6 (six) hours as needed for wheezing or shortness of breath. 06/24/17   Milagros LollSudini, Srikar, MD  amoxicillin-clavulanate (AUGMENTIN) 875-125 MG tablet Take 1 tablet by mouth every 12 (twelve) hours. 06/13/17   Shaune Pollackhen, Qing, MD  aspirin EC 81 MG tablet Take 81 mg by mouth daily.    [provider]  docusate sodium (COLACE) 100 MG capsule Take 100 mg by mouth 2 (two) times daily.    [provider]  ibuprofen (ADVIL,MOTRIN) 800 MG tablet Take 1 tablet (800 mg total) by mouth every 8 (eight) hours as needed. 04/23/16   Rockne MenghiniNorman, Anne-Caroline, MD  ketorolac (  TORADOL) 10 MG tablet Take 1 tablet (10 mg total) by mouth every 6 (six) hours as needed. 01/20/19   Gregor Hams, MD  lamoTRIgine (LAMICTAL) 25 MG tablet Take 1 tablet (25 mg total) by mouth 2 (two) times daily. Patient taking differently: Take 25 mg by mouth every morning.  06/13/17   Demetrios Loll, MD  lamoTRIgine (LAMICTAL) 25 MG tablet Take 50 mg by mouth at bedtime.    [provider]  Multiple Vitamins-Calcium (ONE-A-DAY WOMENS FORMULA) TABS Take 1 tablet by mouth daily.    [provider]      PHYSICAL EXAMINATION:   VITAL SIGNS: Blood pressure 98/75, pulse 69, temperature 98.7 F (37.1 C), temperature source Oral, resp. rate 16, SpO2 100 %.  GENERAL:  33 y.o.-year-old patient lying in the bed with no acute distress.  EYES: Pupils equal, round, reactive to light and accommodation. No scleral icterus. Extraocular muscles intact.  HEENT: Head atraumatic, normocephalic. Oropharynx and nasopharynx clear.  NECK:  Supple, no jugular venous distention. No thyroid enlargement, no tenderness.  LUNGS:Decreased breath sounds bilaterally, rales heard in both lungs. No use of accessory muscles of respiration.  CARDIOVASCULAR: S1, S2 normal. No murmurs, rubs, or gallops.  ABDOMEN: Soft, nontender, nondistended. Bowel sounds present. No organomegaly or mass.  EXTREMITIES: No pedal edema, cyanosis, or clubbing.  NEUROLOGIC: Cranial nerves II through XII are intact. Muscle strength 5/5 in all extremities. Sensation intact. Gait not checked.  PSYCHIATRIC: The patient is alert and oriented x 3.  SKIN: No obvious rash, lesion, or ulcer.   LABORATORY PANEL:   CBC Recent Labs  Lab 05/04/19 1300  WBC 5.0  HGB 9.8*  HCT 31.8*  PLT 313  MCV 88.8  MCH 27.4  MCHC 30.8  RDW 15.5   ------------------------------------------------------------------------------------------------------------------  Chemistries  Recent Labs  Lab 05/04/19 1300  NA 140  K 3.6  CL 106  CO2 25  GLUCOSE 88  BUN 6  CREATININE 0.83  CALCIUM 9.1  AST 33  ALT 27  ALKPHOS 42  BILITOT 0.3   ------------------------------------------------------------------------------------------------------------------ CrCl cannot be calculated (Unknown ideal weight.). ------------------------------------------------------------------------------------------------------------------ No results for input(s): TSH, T4TOTAL, T3FREE, THYROIDAB in the last 72 hours.  Invalid input(s): FREET3   Coagulation  profile No results for input(s): INR, PROTIME in the last 168 hours. ------------------------------------------------------------------------------------------------------------------- No results for input(s): DDIMER in the last 72 hours. -------------------------------------------------------------------------------------------------------------------  Cardiac Enzymes No results for input(s): CKMB, TROPONINI, MYOGLOBIN in the last 168 hours.  Invalid input(s): CK ------------------------------------------------------------------------------------------------------------------ Invalid input(s): POCBNP  ---------------------------------------------------------------------------------------------------------------  Urinalysis    Component Value Date/Time   COLORURINE YELLOW (A) 02/03/2019 0504   APPEARANCEUR HAZY (A) 02/03/2019 0504   APPEARANCEUR HAZY 10/20/2014 1259   LABSPEC 1.028 02/03/2019 0504   LABSPEC 1.020 10/20/2014 1259   PHURINE 5.0 02/03/2019 0504   GLUCOSEU NEGATIVE 02/03/2019 0504   GLUCOSEU NEGATIVE 10/20/2014 1259   HGBUR NEGATIVE 02/03/2019 0504   BILIRUBINUR NEGATIVE 02/03/2019 0504   BILIRUBINUR NEGATIVE 10/20/2014 1259   KETONESUR 20 (A) 02/03/2019 0504   PROTEINUR 30 (A) 02/03/2019 0504   NITRITE NEGATIVE 02/03/2019 0504   LEUKOCYTESUR SMALL (A) 02/03/2019 0504   LEUKOCYTESUR 2+ 10/20/2014 1259     RADIOLOGY: Dg Chest Portable 1 View  Result Date: 05/04/2019 CLINICAL DATA:  Shortness of breath over the last week. Symptoms worsening today. Cough and chest pain. EXAM: PORTABLE CHEST 1 VIEW COMPARISON:  07/05/2015.  01/20/2019 FINDINGS: Heart size is normal. The patient has taken a poor inspiration or there is some pulmonary volume  loss. Question patchy bilateral pulmonary infiltrates. No dense consolidation, lobar collapse or effusion. Chronic scoliosis surgery. IMPRESSION: Diminished lung volumes. Patchy bilateral infiltrates. Most suspicious for hazy  pneumonia. Viral pneumonia could have this appearance. Some of these findings could be due to a poor inspiratory effort. Electronically Signed   By: Paulina FusiMark  Shogry M.D.   On: 05/04/2019 13:46    EKG: Orders placed or performed during the hospital encounter of 05/04/19  . EKG 12-Lead  . EKG 12-Lead  . ED EKG  . ED EKG    IMPRESSION AND PLAN: 33 year old female patient with a known history of substance abuse, scoliosis, seizure disorder, bronchial asthma presented to the emergency room for shortness of breath and wheezing.   -Acute bronchial asthma exacerbation Admit patient to medical floor IV Solu-Medrol 60 MDQ 6 hourly Aggressive nebulization therapy  -Community-acquired pneumonia Start patient on IV Rocephin and Zithromax antibiotic Follow-up WBC count  -Dyspnea secondary to asthma exacerbation Nebulization therapy  -DVT prophylaxis subcu Lovenox daily  -Seizure disorder Continue Lamictal  All the records are reviewed and case discussed with ED provider. Management plans discussed with the patient, family and they are in agreement.  CODE STATUS:Full code Code Status History    Date Active Date Inactive Code Status Order ID Comments User Context   06/21/2017 2135 06/24/2017 2100 Full Code [id number]  Oralia ManisWillis, David, MD ED   06/12/2017 1540 06/13/2017 1947 Full Code [id number]  Ramonita LabGouru, Aruna, MD ED   Advance Care Planning Activity      TOTAL TIME TAKING CARE OF THIS PATIENT: 51 minutes.    Ihor AustinPavan Pyreddy M.D on 05/04/2019 at 3:39 PM  Between 7am to 6pm - Pager - [phone]  After 6pm go to [email or web address] - password [email or web address] [facility]  [facility]  Office  [phone]  CC: Primary care physician; Thornton PapasSykes, Larry Justain, PA-C

## 2019-05-04 NOTE — Progress Notes (Signed)
Advanced care plan. Purpose of the Encounter: CODE STATUS Parties in Attendance: Patient Patient's Decision Capacity: Good Subjective/Patient's story:  Crystal Villegas  is a 33 y.o. female with a known history of substance abuse, scoliosis, seizure disorder, bronchial asthma presented to the emergency room for shortness of breath and wheezing.  Symptoms have been going on for the last 2 days.  Patient also has some cough dry in nature Objective/Medical story Patient needs aggressive nebulization therapy and IV steroids. Needs IV antibiotics. Goals of care determination:  Advance care directives goals of care treatment plan discussed Patient wants everything done, which includes CPR, intubation ventilator if the need arises. CODE STATUS: Full code Time spent discussing advanced care planning: 16 minutes

## 2019-05-04 NOTE — ED Notes (Signed)
Per Dr. Kerman Passey ok to start IV antibiotics after collecting 1 set of blood cultures

## 2019-05-04 NOTE — ED Provider Notes (Signed)
[facility] Emergency Department Provider Note  Time seen: 1:17 PM  I have reviewed the triage vital signs and the nursing notes.   HISTORY  Chief Complaint Shortness of Breath   HPI Crystal Villegas is a 33 y.o. female with a past medical history of asthma, conversion disorder, hypertension, presents to the emergency department for shortness of breath.  According to the patient over the past 1 week she has been wheezing with cough and shortness of breath.  Patient went to urgent care today had a breathing treatment was given Solu-Medrol and was referred to the emergency department.  Here upon arrival patient satting in the mid 90s on room air, 100% on 2 L.  Patient does have wheeze right greater than left.  Denies any fever at any point.  Denies any known sick exposures.  No chest pain or abdominal pain.  Largely negative review of systems otherwise.  States moderate shortness of breath with frequent cough.   Past Medical History:  Diagnosis Date  . Asthma   . Conversion disorder 2018   hospitalized in 2018 for neurological complaints, two different neurologists felt symptoms were likely conversion disorder  . Hypertension   . Polysubstance abuse ([place])    Unclear whether this is an ongoing issue, but hx of multiple using multiple illicit substances  . Scoliosis   . Seizures [facility])     Patient Active Problem List   Diagnosis Date Noted  . HTN (hypertension) 06/21/2017  . Asthma 06/21/2017  . Muscle weakness 06/21/2017  . Seizure ([place]) 06/12/2017    Past Surgical History:  Procedure Laterality Date  . BACK SURGERY    . TUBAL LIGATION      Prior to Admission medications   Medication Sig Start Date End Date Taking? Authorizing Provider  ADVAIR DISKUS 250-50 MCG/DOSE AEPB Inhale 1 puff into the lungs 2 (two) times daily. 06/24/17   Hillary Bow, MD  albuterol (PROVENTIL) (2.5 MG/3ML) 0.083% nebulizer solution Inhale 3 mLs into the lungs every 6 (six)  hours as needed for wheezing or shortness of breath. 06/24/17   Hillary Bow, MD  amoxicillin-clavulanate (AUGMENTIN) 875-125 MG tablet Take 1 tablet by mouth every 12 (twelve) hours. 06/13/17   Demetrios Loll, MD  aspirin EC 81 MG tablet Take 81 mg by mouth daily.    [provider]  docusate sodium (COLACE) 100 MG capsule Take 100 mg by mouth 2 (two) times daily.    [provider]  ibuprofen (ADVIL,MOTRIN) 800 MG tablet Take 1 tablet (800 mg total) by mouth every 8 (eight) hours as needed. 04/23/16   Eula Listen, MD  ketorolac (TORADOL) 10 MG tablet Take 1 tablet (10 mg total) by mouth every 6 (six) hours as needed. 01/20/19   Gregor Hams, MD  lamoTRIgine (LAMICTAL) 25 MG tablet Take 1 tablet (25 mg total) by mouth 2 (two) times daily. Patient taking differently: Take 25 mg by mouth every morning.  06/13/17   Demetrios Loll, MD  lamoTRIgine (LAMICTAL) 25 MG tablet Take 50 mg by mouth at bedtime.    [provider]  Multiple Vitamins-Calcium (ONE-A-DAY WOMENS FORMULA) TABS Take 1 tablet by mouth daily.    [provider]    No Known Allergies  Family History  Family history unknown: Yes    Social History Social History   Tobacco Use  . Smoking status: Former Smoker    Quit date: 05/28/2017    Years since quitting: 1.9  . Smokeless tobacco: Former Systems developer  Quit date: 05/28/2017  Substance Use Topics  . Alcohol use: No  . Drug use: Not Currently    Types: Cocaine    Comment: hx of polysubstance abuse    Review of Systems Constitutional: Negative for fever. Cardiovascular: Mild chest tightness, no chest pain. Respiratory: Positive shortness of breath.  Positive for cough. Gastrointestinal: Negative for abdominal pain, vomiting and diarrhea. Musculoskeletal: Negative for musculoskeletal complaints Skin: Negative for skin complaints  Neurological: Negative for headache All other ROS  negative  ____________________________________________   PHYSICAL EXAM:  VITAL SIGNS: ED Triage Vitals  Enc Vitals Group     BP 05/04/19 1259 122/90     Pulse --      Resp 05/04/19 1259 18     Temp 05/04/19 1259 98.7 F (37.1 C)     Temp Source 05/04/19 1259 Oral     SpO2 05/04/19 1259 100 %     Weight --      Height --      Head Circumference --      Peak Flow --      Pain Score 05/04/19 1236 0     Pain Loc --      Pain Edu? --      Excl. in GC? --    Constitutional: Alert and oriented. Well appearing and in no distress. Eyes: Normal exam ENT      Head: Normocephalic and atraumatic.      Mouth/Throat: Mucous membranes are moist. Cardiovascular: Regular rhythm, rate around 80 to 90 bpm.  No murmur. Respiratory: Mild tachypnea, diffuse mild wheeze/rhonchi, right greater than left. Gastrointestinal: Soft and nontender. No distention.   Musculoskeletal: Nontender with normal range of motion in all extremities. Neurologic:  Normal speech and language. No gross focal neurologic deficits  Skin:  Skin is warm, dry and intact.  Psychiatric: Mood and affect are normal.   ____________________________________________    EKG  EKG viewed and interpreted by myself shows a normal sinus rhythm at 80 bpm with a narrow QRS, right axis deviation, largely normal intervals with no concerning ST changes.  ____________________________________________    RADIOLOGY  Chest x-ray shows patchy bilateral infiltrates.  ____________________________________________   INITIAL IMPRESSION / ASSESSMENT AND PLAN / ED COURSE  Pertinent labs & imaging results that were available during my care of the patient were reviewed by me and considered in my medical decision making (see chart for details).   Patient presents to the emergency department for worsening shortness of breath and cough x1 week.  Patient has a history of asthma, states it feels like an asthma exacerbation.  Differential would  include asthma exacerbation, pneumonia, URI, viral illness such as coronavirus.  We will check labs, chest x-ray, COVID test.  Patient received duo nebs and Solu-Medrol prior to arrival.  We will continue to closely monitor.  Chest x-ray shows patchy bilateral infiltrates.  Patient has become short of breath once again, with increased difficulty breathing.  Patient is currently on oxygen therapy.  We will dose additional duo nebs, check blood cultures, start on IV antibiotics to cover for community-acquired pneumonia.  Given the patient's difficulty breathing with work of breathing we will admit to the hospital for further treatment.  Eyla Shary KeyWhite Johnson was evaluated in Emergency Department on 05/04/2019 for the symptoms described in the history of present illness. She was evaluated in the context of the global COVID-19 pandemic, which necessitated consideration that the patient might be at risk for infection with the SARS-CoV-2 virus that causes COVID-19. Institutional  protocols and algorithms that pertain to the evaluation of patients at risk for COVID-19 are in a state of rapid change based on information released by regulatory bodies including the CDC and federal and state organizations. These policies and algorithms were followed during the patient's care in the ED.  ____________________________________________   FINAL CLINICAL IMPRESSION(S) / ED DIAGNOSES  Dyspnea Pneumonia Asthma exacerbation   Minna AntisPaduchowski, Jacquelyn Shadrick, MD 05/04/19 1441

## 2019-05-04 NOTE — ED Notes (Signed)
Husband curtis updated on plan of care

## 2019-05-04 NOTE — ED Notes (Signed)
Pt ambulated to bathroom without assistance 

## 2019-05-04 NOTE — ED Notes (Signed)
Attempted to give report, RN is still getting report, states she will call me back

## 2019-05-05 LAB — BASIC METABOLIC PANEL
Anion gap: 7 (ref 5–15)
BUN: 8 mg/dL (ref 6–20)
CO2: 24 mmol/L (ref 22–32)
Calcium: 9.3 mg/dL (ref 8.9–10.3)
Chloride: 105 mmol/L (ref 98–111)
Creatinine, Ser: 0.75 mg/dL (ref 0.44–1.00)
GFR calc Af Amer: >60 mL/min (ref >60)
GFR calc non Af Amer: >60 mL/min (ref >60)
Glucose, Bld: 133 mg/dL — ABNORMAL HIGH (ref 70–99)
Potassium: 4.4 mmol/L (ref 3.5–5.1)
Sodium: 136 mmol/L (ref 135–145)

## 2019-05-05 LAB — CBC
HCT: 31.1 % — ABNORMAL LOW (ref 36.0–46.0)
Hemoglobin: 9.5 g/dL — ABNORMAL LOW (ref 12.0–15.0)
MCH: 27.1 pg (ref 26.0–34.0)
MCHC: 30.5 g/dL (ref 30.0–36.0)
MCV: 88.9 fL (ref 80.0–100.0)
Platelets: 315 10*3/uL (ref 150–400)
RBC: 3.5 MIL/uL — ABNORMAL LOW (ref 3.87–5.11)
RDW: 15.9 % — ABNORMAL HIGH (ref 11.5–15.5)
WBC: 17.4 10*3/uL — ABNORMAL HIGH (ref 4.0–10.5)
nRBC: 0 % (ref 0.0–0.2)

## 2019-05-05 MED ORDER — ADVAIR DISKUS 250-50 MCG/DOSE IN AEPB: 1.0000 | INHALATION_SPRAY | Freq: Two times a day (BID) | RESPIRATORY_TRACT | 0 refills | Status: AC

## 2019-05-05 MED ORDER — PREDNISONE 10 MG PO TABS: 40.0000 mg | ORAL_TABLET | Freq: Every day | ORAL | 0 refills | Status: AC

## 2019-05-05 MED ORDER — CEFDINIR 300 MG PO CAPS: 300.0000 mg | ORAL_CAPSULE | Freq: Two times a day (BID) | ORAL | 0 refills | Status: AC

## 2019-05-05 NOTE — Progress Notes (Signed)
Called by Dr. Benjie Karvonen to write work note  for this patient, patient can go to work on Friday without restrictions as per

## 2019-05-05 NOTE — Discharge Summary (Signed)
[facility] - [place] [facility]   PATIENT NAME: Crystal Villegas    MR#:  [id number]  DATE OF BIRTH:  [birth year]  DATE OF ADMISSION:  05/04/2019 ADMITTING PHYSICIAN: Ihor AustinPavan Pyreddy, MD  DATE OF DISCHARGE: 05/05/2019  PRIMARY CARE PHYSICIAN: Thornton PapasSykes, Larry Justain, PA-C    ADMISSION DIAGNOSIS:  Shortness of breath [R06.02] Moderate persistent asthma with exacerbation [J45.41] Community acquired pneumonia, unspecified laterality [J18.9]  DISCHARGE DIAGNOSIS:  Active Problems:   Asthma exacerbation   SECONDARY DIAGNOSIS:   Past Medical History:  Diagnosis Date  . Asthma   . Conversion disorder 2018   hospitalized in 2018 for neurological complaints, two different neurologists felt symptoms were likely conversion disorder  . Hypertension   . Polysubstance abuse (HCC)    Unclear whether this is an ongoing issue, but hx of multiple using multiple illicit substances  . Scoliosis   . Seizures [facility](HCC)     HOSPITAL COURSE:   33 year old female with history of asthma and conversion disorder who presents with shortness of breath.  1.  Acute hypoxic respiratory failure in the setting of asthma exacerbation along with community-acquired pneumonia: Patient weaned off of oxygen.  2.  Community-acquired pneumonia: Patient will be discharged on oral cefdinir  3.  Mild intermittent asthma exacerbation: Patient has no wheezing on examination at the time of discharge.  She was discharged on oral steroids and continue inhalers.  4.  Conversion disorder: Patient will continue outpatient regimen  5.  History of seizures on lamotrigine   DISCHARGE CONDITIONS AND DIET:  Stable for discharge regular diet  CONSULTS OBTAINED:    DRUG ALLERGIES:  No Known Allergies  DISCHARGE MEDICATIONS:   Allergies as of 05/05/2019   No Known Allergies     Medication List    STOP taking these medications   albuterol (2.5 MG/3ML) 0.083% nebulizer solution Commonly known as:  PROVENTIL   ibuprofen 800 MG tablet Commonly known as: ADVIL     TAKE these medications   Advair Diskus 250-50 MCG/DOSE Aepb Generic drug: Fluticasone-Salmeterol Inhale 1 puff into the lungs 2 (two) times daily.   aspirin EC 81 MG tablet Take 81 mg by mouth daily.   Cartia XT 120 MG 24 hr capsule Generic drug: diltiazem Take 120 mg by mouth daily.   cefdinir 300 MG capsule Commonly known as: OMNICEF Take 1 capsule (300 mg total) by mouth 2 (two) times daily for 4 days.   docusate sodium 100 MG capsule Commonly known as: COLACE Take 100 mg by mouth 2 (two) times daily.   lamoTRIgine 25 MG tablet Commonly known as: LAMICTAL Take 50 mg by mouth at bedtime.   lamoTRIgine 25 MG tablet Commonly known as: LAMICTAL Take 50 mg by mouth 2 (two) times a day.   One-A-Day Womens Formula Tabs Take 1 tablet by mouth daily.   predniSONE 10 MG tablet Commonly known as: DELTASONE Take 4 tablets (40 mg total) by mouth daily with breakfast for 5 days.         Today   CHIEF COMPLAINT:  No acute issues overnight   VITAL SIGNS:  Blood pressure 110/67, pulse 60, temperature 98.1 F (36.7 C), temperature source Oral, resp. rate 20, height 4\' 11"  (1.499 m), weight 48 kg, SpO2 100 %.   REVIEW OF SYSTEMS:  Review of Systems  Constitutional: Negative.  Negative for chills, fever and malaise/fatigue.  HENT: Negative.  Negative for ear discharge, ear pain, hearing loss, nosebleeds and sore throat.   Eyes: Negative.  Negative  for blurred vision and pain.  Respiratory: Negative.  Negative for cough, hemoptysis, shortness of breath and wheezing.   Cardiovascular: Negative.  Negative for chest pain, palpitations and leg swelling.  Gastrointestinal: Negative.  Negative for abdominal pain, blood in stool, diarrhea, nausea and vomiting.  Genitourinary: Negative.  Negative for dysuria.  Musculoskeletal: Negative.  Negative for back pain.  Skin: Negative.   Neurological: Negative for  dizziness, tremors, speech change, focal weakness, seizures and headaches.  Endo/Heme/Allergies: Negative.  Does not bruise/bleed easily.  Psychiatric/Behavioral: Negative.  Negative for depression, hallucinations and suicidal ideas.     PHYSICAL EXAMINATION:  GENERAL:  33 y.o.-year-old patient lying in the bed with no acute distress.  NECK:  Supple, no jugular venous distention. No thyroid enlargement, no tenderness.  LUNGS: Normal breath sounds bilaterally, no wheezing, rales,rhonchi  No use of accessory muscles of respiration.  CARDIOVASCULAR: S1, S2 normal. No murmurs, rubs, or gallops.  ABDOMEN: Soft, non-tender, non-distended. Bowel sounds present. No organomegaly or mass.  EXTREMITIES: No pedal edema, cyanosis, or clubbing.  PSYCHIATRIC: The patient is alert and oriented x 3.  SKIN: No obvious rash, lesion, or ulcer.   DATA REVIEW:   CBC Recent Labs  Lab 05/05/19 0422  WBC 17.4*  HGB 9.5*  HCT 31.1*  PLT 315    Chemistries  Recent Labs  Lab 05/04/19 1300 05/05/19 0422  NA 140 136  K 3.6 4.4  CL 106 105  CO2 25 24  GLUCOSE 88 133*  BUN 6 8  CREATININE 0.83 0.75  CALCIUM 9.1 9.3  AST 33  --   ALT 27  --   ALKPHOS 42  --   BILITOT 0.3  --     Cardiac Enzymes No results for input(s): TROPONINI in the last 168 hours.  Microbiology Results  @[email or web address] @  RADIOLOGY:  Dg Chest Portable 1 View  Result Date: 05/04/2019 CLINICAL DATA:  Shortness of breath over the last week. Symptoms worsening today. Cough and chest pain. EXAM: PORTABLE CHEST 1 VIEW COMPARISON:  07/05/2015.  01/20/2019 FINDINGS: Heart size is normal. The patient has taken a poor inspiration or there is some pulmonary volume loss. Question patchy bilateral pulmonary infiltrates. No dense consolidation, lobar collapse or effusion. Chronic scoliosis surgery. IMPRESSION: Diminished lung volumes. Patchy bilateral infiltrates. Most suspicious for hazy pneumonia. Viral pneumonia could have this  appearance. Some of these findings could be due to a poor inspiratory effort. Electronically Signed   By: Paulina FusiMark  Shogry M.D.   On: 05/04/2019 13:46      Allergies as of 05/05/2019   No Known Allergies     Medication List    STOP taking these medications   albuterol (2.5 MG/3ML) 0.083% nebulizer solution Commonly known as: PROVENTIL   ibuprofen 800 MG tablet Commonly known as: ADVIL     TAKE these medications   Advair Diskus 250-50 MCG/DOSE Aepb Generic drug: Fluticasone-Salmeterol Inhale 1 puff into the lungs 2 (two) times daily.   aspirin EC 81 MG tablet Take 81 mg by mouth daily.   Cartia XT 120 MG 24 hr capsule Generic drug: diltiazem Take 120 mg by mouth daily.   cefdinir 300 MG capsule Commonly known as: OMNICEF Take 1 capsule (300 mg total) by mouth 2 (two) times daily for 4 days.   docusate sodium 100 MG capsule Commonly known as: COLACE Take 100 mg by mouth 2 (two) times daily.   lamoTRIgine 25 MG tablet Commonly known as: LAMICTAL Take 50 mg by mouth at bedtime.   lamoTRIgine  25 MG tablet Commonly known as: LAMICTAL Take 50 mg by mouth 2 (two) times a day.   One-A-Day Womens Formula Tabs Take 1 tablet by mouth daily.   predniSONE 10 MG tablet Commonly known as: DELTASONE Take 4 tablets (40 mg total) by mouth daily with breakfast for 5 days.          Management plans discussed with the patient and she is in agreement. Stable for discharge home  Patient should follow up with pcp  CODE STATUS:     Code Status Orders  (From admission, onward)         Start     Ordered   05/04/19 2010  Full code  Continuous     05/04/19 2009        Code Status History    Date Active Date Inactive Code Status Order ID Comments User Context   06/21/2017 2135 06/24/2017 2100 Full Code [id number]  Lance Coon, MD ED   06/12/2017 1540 06/13/2017 1947 Full Code [id number]  Nicholes Mango, MD ED   Advance Care Planning Activity      TOTAL TIME TAKING CARE OF  THIS PATIENT: 38 minutes.    Note: This dictation was prepared with Dragon dictation along with smaller phrase technology. Any transcriptional errors that result from this process are unintentional.  Bettey Costa M.D on 05/05/2019 at 10:46 AM  Between 7am to 6pm - Pager - [phone] After 6pm go to [email or web address] - Proofreader  [facility]  Office  [phone]  CC: Primary care physician; Nathaneil Canary, PA-C

## 2019-05-05 NOTE — Progress Notes (Signed)
     Crystal Villegas was admitted to the Hospital on 05/04/2019 and Discharged  05/05/2019 and should be excused from work/school   for 3 days starting 05/04/2019 , may return to work/school without any restrictions.    Epifanio Lesches M.D on 05/05/2019,at 3:56 PM

## 2019-05-05 NOTE — Progress Notes (Signed)
Discharge instructions given. Pt verbalizes understanding. Pt awaiting ride home.

## 2019-05-05 NOTE — Discharge Instructions (Signed)
Asthma, Adult ° °Asthma is a long-term (chronic) condition in which the airways get tight and narrow. The airways are the breathing passages that lead from the nose and mouth down into the lungs. A person with asthma will have times when symptoms get worse. These are called asthma attacks. They can cause coughing, whistling sounds when you breathe (wheezing), shortness of breath, and chest pain. They can make it hard to breathe. There is no cure for asthma, but medicines and lifestyle changes can help control it. °There are many things that can bring on an asthma attack or make asthma symptoms worse (triggers). Common triggers include: °· Mold. °· Dust. °· Cigarette smoke. °· Cockroaches. °· Things that can cause allergy symptoms (allergens). These include animal skin flakes (dander) and pollen from trees or grass. °· Things that pollute the air. These may include household cleaners, wood smoke, smog, or chemical odors. °· Cold air, weather changes, and wind. °· Crying or laughing hard. °· Stress. °· Certain medicines or drugs. °· Certain foods such as dried fruit, potato chips, and grape juice. °· Infections, such as a cold or the flu. °· Certain medical conditions or diseases. °· Exercise or tiring activities. °Asthma may be treated with medicines and by staying away from the things that cause asthma attacks. Types of medicines may include: °· Controller medicines. These help prevent asthma symptoms. They are usually taken every day. °· Fast-acting reliever or rescue medicines. These quickly relieve asthma symptoms. They are used as needed and provide short-term relief. °· Allergy medicines if your attacks are brought on by allergens. °· Medicines to help control the body's defense (immune) system. °Follow these instructions at home: °Avoiding triggers in your home °· Change your heating and air conditioning filter often. °· Limit your use of fireplaces and wood stoves. °· Get rid of pests (such as roaches and  mice) and their droppings. °· Throw away plants if you see mold on them. °· Clean your floors. Dust regularly. Use cleaning products that do not smell. °· Have someone vacuum when you are not home. Use a vacuum cleaner with a HEPA filter if possible. °· Replace carpet with wood, tile, or vinyl flooring. Carpet can trap animal skin flakes and dust. °· Use allergy-proof pillows, mattress covers, and box spring covers. °· Wash bed sheets and blankets every week in hot water. Dry them in a dryer. °· Keep your bedroom free of any triggers. °· Avoid pets and keep windows closed when things that cause allergy symptoms are in the air. °· Use blankets that are made of polyester or cotton. °· Clean bathrooms and kitchens with bleach. If possible, have someone repaint the walls in these rooms with mold-resistant paint. Keep out of the rooms that are being cleaned and painted. °· Wash your hands often with soap and water. If soap and water are not available, use hand sanitizer. °· Do not allow anyone to smoke in your home. °General instructions °· Take over-the-counter and prescription medicines only as told by your doctor. °? Talk with your doctor if you have questions about how or when to take your medicines. °? Make note if you need to use your medicines more often than usual. °· Do not use any products that contain nicotine or tobacco, such as cigarettes and e-cigarettes. If you need help quitting, ask your doctor. °· Stay away from secondhand smoke. °· Avoid doing things outdoors when allergen counts are high and when air quality is low. °· Wear a ski mask   when doing outdoor activities in the winter. The mask should cover your nose and mouth. Exercise indoors on cold days if you can. °· Warm up before you exercise. Take time to cool down after exercise. °· Use a peak flow meter as told by your doctor. A peak flow meter is a tool that measures how well the lungs are working. °· Keep track of the peak flow meter's readings.  Write them down. °· Follow your asthma action plan. This is a written plan for taking care of your asthma and treating your attacks. °· Make sure you get all the shots (vaccines) that your doctor recommends. Ask your doctor about a flu shot and a pneumonia shot. °· Keep all follow-up visits as told by your doctor. This is important. °Contact a doctor if: °· You have wheezing, shortness of breath, or a cough even while taking medicine to prevent attacks. °· The mucus you cough up (sputum) is thicker than usual. °· The mucus you cough up changes from clear or white to yellow, green, gray, or bloody. °· You have problems from the medicine you are taking, such as: °? A rash. °? Itching. °? Swelling. °? Trouble breathing. °· You need reliever medicines more than 2-3 times a week. °· Your peak flow reading is still at 50-79% of your personal best after following the action plan for 1 hour. °· You have a fever. °Get help right away if: °· You seem to be worse and are not responding to medicine during an asthma attack. °· You are short of breath even at rest. °· You get short of breath when doing very little activity. °· You have trouble eating, drinking, or talking. °· You have chest pain or tightness. °· You have a fast heartbeat. °· Your lips or fingernails start to turn blue. °· You are light-headed or dizzy, or you faint. °· Your peak flow is less than 50% of your personal best. °· You feel too tired to breathe normally. °Summary °· Asthma is a long-term (chronic) condition in which the airways get tight and narrow. An asthma attack can make it hard to breathe. °· Asthma cannot be cured, but medicines and lifestyle changes can help control it. °· Make sure you understand how to avoid triggers and how and when to use your medicines. °This information is not intended to replace advice given to you by your health care provider. Make sure you discuss any questions you have with your health care provider. °Document  Released: 03/18/2008 Document Revised: 12/03/2018 Document Reviewed: 11/04/2016 °Elsevier Patient Education © 2020 [employer] ° °

## 2019-05-06 LAB — HIV ANTIBODY (ROUTINE TESTING W REFLEX): HIV Screen 4th Generation wRfx: NONREACTIVE

## 2019-05-09 LAB — CULTURE, BLOOD (ROUTINE X 2)
Culture: NO GROWTH
Culture: NO GROWTH
Special Requests: ADEQUATE
Special Requests: ADEQUATE

## 2019-09-10 IMAGING — DX PORTABLE CHEST - 1 VIEW
1 series · 1 of 1 positions shown · non-contrast
Comparison: 07/05/2015.  01/20/2019

CLINICAL DATA: Shortness of breath over the last week. Symptoms
worsening today. Cough and chest pain.

EXAM:
PORTABLE CHEST 1 VIEW

[chest ap]
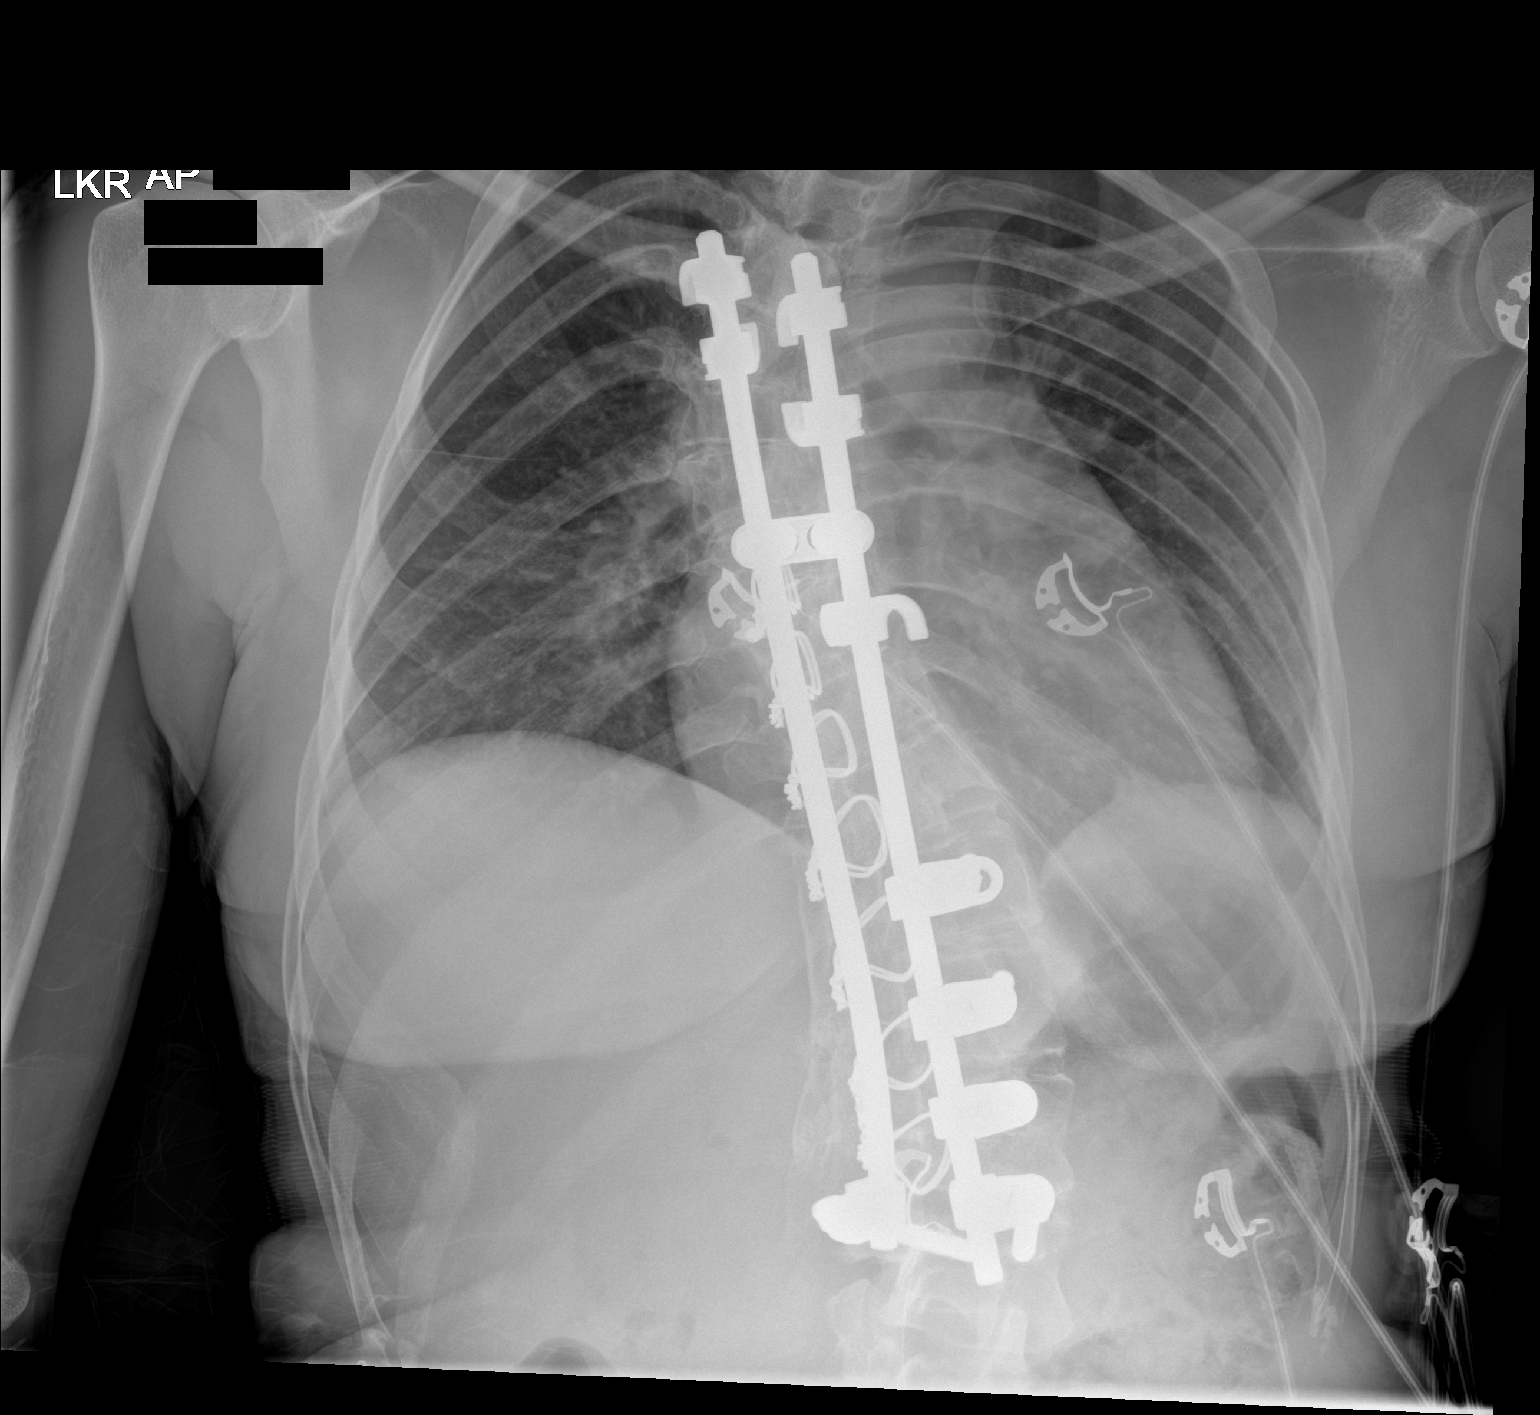

[1 of 1 positions shown; findings below may reference images not displayed]

FINDINGS: Heart size is normal. The patient has taken a poor inspiration or
there is some pulmonary volume loss. Question patchy bilateral
pulmonary infiltrates. No dense consolidation, lobar collapse or
effusion. Chronic scoliosis surgery.
IMPRESSION: Diminished lung volumes. Patchy bilateral infiltrates. Most
suspicious for hazy pneumonia. Viral pneumonia could have this
appearance. Some of these findings could be due to a poor
inspiratory effort.

## 2022-04-09 ENCOUNTER — Encounter (HOSPITAL_COMMUNITY): Payer: Self-pay

## 2022-04-09 ENCOUNTER — Emergency Department (HOSPITAL_COMMUNITY)
Admission: EM | Admit: 2022-04-09 | Discharge: 2022-04-10 | Disposition: A | Discharge status: Home / Self Care | Payer: Medicaid Other | Attending: Emergency Medicine | Admitting: Emergency Medicine

## 2022-04-09 DIAGNOSIS — Y906 Blood alcohol level of 120-199 mg/100 ml: Secondary | ICD-10-CM | POA: Insufficient documentation

## 2022-04-09 DIAGNOSIS — J45909 Unspecified asthma, uncomplicated: Secondary | ICD-10-CM | POA: Diagnosis not present

## 2022-04-09 DIAGNOSIS — N9489 Other specified conditions associated with female genital organs and menstrual cycle: Secondary | ICD-10-CM | POA: Insufficient documentation

## 2022-04-09 DIAGNOSIS — R4781 Slurred speech: Secondary | ICD-10-CM | POA: Diagnosis present

## 2022-04-09 DIAGNOSIS — F1090 Alcohol use, unspecified, uncomplicated: Secondary | ICD-10-CM | POA: Insufficient documentation

## 2022-04-09 DIAGNOSIS — R42 Dizziness and giddiness: Secondary | ICD-10-CM | POA: Insufficient documentation

## 2022-04-09 DIAGNOSIS — F1290 Cannabis use, unspecified, uncomplicated: Secondary | ICD-10-CM | POA: Insufficient documentation

## 2022-04-09 DIAGNOSIS — F1490 Cocaine use, unspecified, uncomplicated: Secondary | ICD-10-CM | POA: Diagnosis not present

## 2022-04-09 DIAGNOSIS — T50904A Poisoning by unspecified drugs, medicaments and biological substances, undetermined, initial encounter: Secondary | ICD-10-CM

## 2022-04-09 DIAGNOSIS — I10 Essential (primary) hypertension: Secondary | ICD-10-CM | POA: Insufficient documentation

## 2022-04-10 LAB — COMPREHENSIVE METABOLIC PANEL
ALT: 13 U/L (ref 0–44)
AST: 25 U/L (ref 15–41)
Albumin: 3.6 g/dL (ref 3.5–5.0)
Alkaline Phosphatase: 32 U/L — ABNORMAL LOW (ref 38–126)
Anion gap: 9 (ref 5–15)
BUN: 10 mg/dL (ref 6–20)
CO2: 20 mmol/L — ABNORMAL LOW (ref 22–32)
Calcium: 8.6 mg/dL — ABNORMAL LOW (ref 8.9–10.3)
Chloride: 114 mmol/L — ABNORMAL HIGH (ref 98–111)
Creatinine, Ser: 1.07 mg/dL — ABNORMAL HIGH (ref 0.44–1.00)
GFR, Estimated: >60 mL/min (ref >60)
Glucose, Bld: 93 mg/dL (ref 70–99)
Potassium: 3.8 mmol/L (ref 3.5–5.1)
Sodium: 143 mmol/L (ref 135–145)
Total Bilirubin: 0.3 mg/dL (ref 0.3–1.2)
Total Protein: 6.6 g/dL (ref 6.5–8.1)

## 2022-04-10 LAB — CBC
HCT: 25.4 % — ABNORMAL LOW (ref 36.0–46.0)
Hemoglobin: 7.2 g/dL — ABNORMAL LOW (ref 12.0–15.0)
MCH: 21.2 pg — ABNORMAL LOW (ref 26.0–34.0)
MCHC: 28.3 g/dL — ABNORMAL LOW (ref 30.0–36.0)
MCV: 74.9 fL — ABNORMAL LOW (ref 80.0–100.0)
Platelets: 443 10*3/uL — ABNORMAL HIGH (ref 150–400)
RBC: 3.39 MIL/uL — ABNORMAL LOW (ref 3.87–5.11)
RDW: 19.6 % — ABNORMAL HIGH (ref 11.5–15.5)
WBC: 7.3 10*3/uL (ref 4.0–10.5)
nRBC: 0 % (ref 0.0–0.2)

## 2022-04-10 LAB — RAPID URINE DRUG SCREEN, HOSP PERFORMED
Amphetamines: NOT DETECTED
Barbiturates: NOT DETECTED
Benzodiazepines: NOT DETECTED
Cocaine: POSITIVE — AB
Opiates: NOT DETECTED
Tetrahydrocannabinol: POSITIVE — AB

## 2022-04-10 LAB — ACETAMINOPHEN LEVEL: Acetaminophen (Tylenol), Serum: <10 ug/mL — ABNORMAL LOW (ref 10–30)

## 2022-04-10 LAB — SALICYLATE LEVEL: Salicylate Lvl: <7 mg/dL — ABNORMAL LOW (ref 7.0–30.0)

## 2022-04-10 LAB — HCG, SERUM, QUALITATIVE: Preg, Serum: NEGATIVE

## 2022-04-10 LAB — ETHANOL: Alcohol, Ethyl (B): 142 mg/dL — ABNORMAL HIGH (ref <10)

## 2022-04-10 MED ORDER — SODIUM CHLORIDE 0.9 % IV BOLUS
1000.0000 mL | Freq: Once | INTRAVENOUS | Status: AC
  Administered 2022-04-10: 1000 mL via INTRAVENOUS

## 2022-04-10 NOTE — ED Provider Notes (Signed)
[facility]-EMERGENCY DEPT Provider Note   CSN: [id number] Arrival date & time: 04/09/22  2326     History  Chief Complaint  Patient presents with   Altered Mental Status    Crystal Villegas is a 36 y.o. female.  Patient is a 36 year old female with past medical history of asthma, seizures, hypertension.  Patient presenting today with complaints of "I feel funny".  EMS was called after patient had been drinking alcohol and she was given an unknown substance by her brother's friend.  She believes that this pill was either slipped in her alcohol or slipped into her regular medications without her knowing.  She reports feeling anxious and paranoid.  She denies any physical pain or injury.  She arrives here with slurred speech.  The history is provided by the patient.       Home Medications Prior to Admission medications   Medication Sig Start Date End Date Taking? Authorizing Provider  ADVAIR DISKUS 250-50 MCG/DOSE AEPB Inhale 1 puff into the lungs 2 (two) times daily. 05/05/19   Adrian Saran, MD  aspirin EC 81 MG tablet Take 81 mg by mouth daily.    [provider]  diltiazem (CARTIA XT) 120 MG 24 hr capsule Take 120 mg by mouth daily. 05/21/18   [provider]  docusate sodium (COLACE) 100 MG capsule Take 100 mg by mouth 2 (two) times daily.    [provider]  lamoTRIgine (LAMICTAL) 25 MG tablet Take 50 mg by mouth at bedtime.    [provider]  lamoTRIgine (LAMICTAL) 25 MG tablet Take 50 mg by mouth 2 (two) times a day. 04/28/18   [provider]  Multiple Vitamins-Calcium (ONE-A-DAY WOMENS FORMULA) TABS Take 1 tablet by mouth daily.    [provider]      Allergies    Patient has no known allergies.    Review of Systems   Review of Systems  All other systems reviewed and are negative.   Physical Exam Updated Vital Signs BP 107/66   Pulse 98   Temp (!) 97.4 F (36.3 C) (Oral)   Resp 18    SpO2 96%  Physical Exam Vitals and nursing note reviewed.  Constitutional:      General: She is not in acute distress.    Appearance: She is well-developed. She is not diaphoretic.     Comments: Patient is awake and alert.  Her speech is slurred and she appears under the influence.  HENT:     Head: Normocephalic and atraumatic.  Cardiovascular:     Rate and Rhythm: Normal rate and regular rhythm.     Heart sounds: No murmur heard.    No friction rub. No gallop.  Pulmonary:     Effort: Pulmonary effort is normal. No respiratory distress.     Breath sounds: Normal breath sounds. No wheezing.  Abdominal:     General: Bowel sounds are normal. There is no distension.     Palpations: Abdomen is soft.     Tenderness: There is no abdominal tenderness.  Musculoskeletal:        General: Normal range of motion.     Cervical back: Normal range of motion and neck supple.  Skin:    General: Skin is warm and dry.  Neurological:     General: No focal deficit present.     Mental Status: She is alert and oriented to person, place, and time.     Cranial Nerves: No cranial nerve deficit.  Motor: No weakness.     ED Results / Procedures / Treatments   Labs (all labs ordered are listed, but only abnormal results are displayed) Labs Reviewed  COMPREHENSIVE METABOLIC PANEL  ETHANOL  SALICYLATE LEVEL  ACETAMINOPHEN LEVEL  CBC  RAPID URINE DRUG SCREEN, HOSP PERFORMED    EKG None  Radiology No results found.  Procedures Procedures    Medications Ordered in ED Medications  sodium chloride 0.9 % bolus 1,000 mL (has no administration in time range)    ED Course/ Medical Decision Making/ A&P  Patient brought here by EMS after ingesting an unknown substance.  Patient believes that she was given a medication by her brothers friend.  This medication was given by having it slipped into her alcoholic beverage or into her medications.  Patient reports feeling dizzy and unwell.  Patient  arrives here with stable vital signs and is in no distress.  Physical examination is unremarkable.  Laboratory studies initiated showing no acute abnormality with the exception of a blood alcohol of 142 and tox screen positive for cocaine and marijuana.  Patient has been observed for nearly 6 hours.  She has remained hemodynamically stable and is well-appearing.  At this point I feel as though patient can safely be discharged with outpatient follow-up.  Final Clinical Impression(s) / ED Diagnoses Final diagnoses:  None    Rx / DC Orders ED Discharge Orders     None         Geoffery Lyons, MD 04/10/22 [phone]

## 2022-04-10 NOTE — Discharge Instructions (Signed)
Continue your medications as previously prescribed.  Follow-up with your primary doctor and return to the ER for any new and/or concerning symptoms.

## 2022-09-07 ENCOUNTER — Emergency Department (HOSPITAL_COMMUNITY): Payer: Medicaid Other

## 2022-09-07 ENCOUNTER — Emergency Department (HOSPITAL_COMMUNITY)
Admission: EM | Admit: 2022-09-07 | Discharge: 2022-09-08 | Disposition: A | Discharge status: Home / Self Care | Payer: Medicaid Other | Attending: Emergency Medicine | Admitting: Emergency Medicine

## 2022-09-07 ENCOUNTER — Other Ambulatory Visit: Payer: Self-pay

## 2022-09-07 DIAGNOSIS — R0602 Shortness of breath: Secondary | ICD-10-CM | POA: Diagnosis present

## 2022-09-07 DIAGNOSIS — Z7982 Long term (current) use of aspirin: Secondary | ICD-10-CM | POA: Diagnosis not present

## 2022-09-07 DIAGNOSIS — J9801 Acute bronchospasm: Secondary | ICD-10-CM | POA: Diagnosis not present

## 2022-09-07 DIAGNOSIS — Z79899 Other long term (current) drug therapy: Secondary | ICD-10-CM | POA: Insufficient documentation

## 2022-09-07 DIAGNOSIS — I10 Essential (primary) hypertension: Secondary | ICD-10-CM | POA: Insufficient documentation

## 2022-09-07 DIAGNOSIS — J45909 Unspecified asthma, uncomplicated: Secondary | ICD-10-CM | POA: Diagnosis not present

## 2022-09-07 DIAGNOSIS — Z7951 Long term (current) use of inhaled steroids: Secondary | ICD-10-CM | POA: Diagnosis not present

## 2022-09-07 LAB — BASIC METABOLIC PANEL
Anion gap: 11 (ref 5–15)
BUN: 5 mg/dL — ABNORMAL LOW (ref 6–20)
CO2: 23 mmol/L (ref 22–32)
Calcium: 9.5 mg/dL (ref 8.9–10.3)
Chloride: 102 mmol/L (ref 98–111)
Creatinine, Ser: 0.79 mg/dL (ref 0.44–1.00)
GFR, Estimated: >60 mL/min (ref >60)
Glucose, Bld: 103 mg/dL — ABNORMAL HIGH (ref 70–99)
Potassium: 4 mmol/L (ref 3.5–5.1)
Sodium: 136 mmol/L (ref 135–145)

## 2022-09-07 LAB — CBC
HCT: 32.7 % — ABNORMAL LOW (ref 36.0–46.0)
Hemoglobin: 9.3 g/dL — ABNORMAL LOW (ref 12.0–15.0)
MCH: 22.6 pg — ABNORMAL LOW (ref 26.0–34.0)
MCHC: 28.4 g/dL — ABNORMAL LOW (ref 30.0–36.0)
MCV: 79.4 fL — ABNORMAL LOW (ref 80.0–100.0)
Platelets: 311 10*3/uL (ref 150–400)
RBC: 4.12 MIL/uL (ref 3.87–5.11)
RDW: 19.9 % — ABNORMAL HIGH (ref 11.5–15.5)
WBC: 5.1 10*3/uL (ref 4.0–10.5)
nRBC: 0 % (ref 0.0–0.2)

## 2022-09-07 LAB — HCG, QUANTITATIVE, PREGNANCY: hCG, Beta Chain, Quant, S: <1 m[IU]/mL (ref <5)

## 2022-09-07 MED ORDER — DEXAMETHASONE 4 MG PO TABS
10.0000 mg | ORAL_TABLET | Freq: Once | ORAL | Status: AC
  Administered 2022-09-08: 10 mg via ORAL
  Filled 2022-09-07: qty 3

## 2022-09-07 MED ORDER — ALBUTEROL SULFATE HFA 108 (90 BASE) MCG/ACT IN AERS
2.0000 | INHALATION_SPRAY | Freq: Once | RESPIRATORY_TRACT | Status: AC
  Administered 2022-09-08: 2 via RESPIRATORY_TRACT
  Filled 2022-09-07: qty 6.7

## 2022-09-07 NOTE — ED Notes (Signed)
Pt called for triage, no answer

## 2022-09-07 NOTE — ED Triage Notes (Signed)
Pt presents with shob and chest pain, (for 36 years) per pt.  Pt states she has not had her medications. When asked which medications she stated "the breathing ones"  Pt's family offered info about pt's complaint stating she has had sore throat and productive cough.

## 2022-09-07 NOTE — ED Provider Notes (Signed)
[facility] EMERGENCY DEPARTMENT Provider Note   CSN: [id number] Arrival date & time: 09/07/22  2146     History {Add pertinent medical, surgical, social history, OB history to HPI:1} Chief Complaint  Patient presents with   Shortness of Breath   Chest Pain    Crystal Villegas is a 36 y.o. female.  36 year old female presents to the emergency department complaining of shortness of breath.  This has been an ongoing issue.  She said she has had "asthma and bronchitis for 34 years".  Friend at bedside reports that patient has been out of her inhaler since yesterday.  Symptoms associated with chest tightness without fever or chills.  She denies known sick contacts.  Does not believe that she has the flu or COVID because she "tested negative before my surgery"; she will not tell me when this reported surgery was.  The history is provided by the patient. No language interpreter was used.  Shortness of Breath Associated symptoms: chest pain   Chest Pain Associated symptoms: shortness of breath        Home Medications Prior to Admission medications   Medication Sig Start Date End Date Taking? Authorizing Provider  ADVAIR DISKUS 250-50 MCG/DOSE AEPB Inhale 1 puff into the lungs 2 (two) times daily. 05/05/19   Adrian Saran, MD  aspirin EC 81 MG tablet Take 81 mg by mouth daily.    [provider]  diltiazem (CARTIA XT) 120 MG 24 hr capsule Take 120 mg by mouth daily. 05/21/18   [provider]  docusate sodium (COLACE) 100 MG capsule Take 100 mg by mouth 2 (two) times daily.    [provider]  lamoTRIgine (LAMICTAL) 25 MG tablet Take 50 mg by mouth at bedtime.    [provider]  lamoTRIgine (LAMICTAL) 25 MG tablet Take 50 mg by mouth 2 (two) times a day. 04/28/18   [provider]  Multiple Vitamins-Calcium (ONE-A-DAY WOMENS FORMULA) TABS Take 1 tablet by mouth daily.    [provider]      Allergies    Patient  has no known allergies.    Review of Systems   Review of Systems  Respiratory:  Positive for shortness of breath.   Cardiovascular:  Positive for chest pain.  Ten systems reviewed and are negative for acute change, except as noted in the HPI.    Physical Exam Updated Vital Signs BP 107/85 (BP Location: Right Arm)   Pulse (!) 107   Temp 99.3 F (37.4 C) (Oral)   Resp 18   SpO2 100%   Physical Exam Vitals and nursing note reviewed.  Constitutional:      General: She is not in acute distress.    Appearance: She is well-developed. She is not diaphoretic.     Comments: Nontoxic appearing and in NAD  HENT:     Head: Normocephalic and atraumatic.  Eyes:     General: No scleral icterus.    Conjunctiva/sclera: Conjunctivae normal.  Pulmonary:     Effort: Pulmonary effort is normal. No respiratory distress.     Breath sounds: No stridor. Wheezing present. No rhonchi.     Comments: Diffuse expiratory wheezing. No tachypnea, dyspnea, or accessory muscle use. Speaking in full sentences. Musculoskeletal:        General: Normal range of motion.     Cervical back: Normal range of motion.  Skin:    General: Skin is warm and dry.     Coloration: Skin is not pale.  Findings: No erythema or rash.  Neurological:     Mental Status: She is alert and oriented to person, place, and time.  Psychiatric:        Mood and Affect: Affect is angry.        Speech: Speech is rapid and pressured.        Behavior: Behavior is uncooperative and agitated.        Judgment: Judgment is impulsive.     ED Results / Procedures / Treatments   Labs (all labs ordered are listed, but only abnormal results are displayed) Labs Reviewed  CBC - Abnormal; Notable for the following components:      Result Value   Hemoglobin 9.3 (*)    HCT 32.7 (*)    MCV 79.4 (*)    MCH 22.6 (*)    MCHC 28.4 (*)    RDW 19.9 (*)    All other components within normal limits  BASIC METABOLIC PANEL - Abnormal; Notable for  the following components:   Glucose, Bld 103 (*)    BUN 5 (*)    All other components within normal limits  HCG, QUANTITATIVE, PREGNANCY    EKG None  Radiology DG Chest 2 View  Result Date: 09/07/2022 CLINICAL DATA:  Chest pain and shortness of breath. EXAM: CHEST - 2 VIEW COMPARISON:  May 04, 2019 FINDINGS: The heart size and mediastinal contours are within normal limits. Both lungs are clear. Postoperative changes are again seen throughout the thoracolumbar spine. IMPRESSION: No active cardiopulmonary disease. Electronically Signed   By: Aram Candela M.D.   On: 09/07/2022 22:30    Procedures Procedures  {Document cardiac monitor, telemetry assessment procedure when appropriate:1}  Medications Ordered in ED Medications  dexamethasone (DECADRON) tablet 10 mg (has no administration in time range)  albuterol (VENTOLIN HFA) 108 (90 Base) MCG/ACT inhaler 2 puff (has no administration in time range)    ED Course/ Medical Decision Making/ A&P Clinical Course as of 09/07/22 2358  Sat Sep 07, 2022  2354 Patient is incredibly agitated and irritated.  She will not answer any of my questions because she's had "asthma and bronchitis for 34 years". When asked if she has a PCP to prescribe her asthma medications she says that her "primary care doctor is Jesus". She doesn't know why she has been "sitting here for 24 hours" with her hard time breathing. I tried to sit next to the patient to de-escalate her mood, but this was futile. I explained to the patient that I am happy to come and reassess her when she is more calm and willing to participate in her ED encounter. [KH]    Clinical Course User Index [KH] Antony Madura, PA-C                           Medical Decision Making Amount and/or Complexity of Data Reviewed ECG/medicine tests: ordered.  Risk Prescription drug management.   ***  {Document critical care time when appropriate:1} {Document review of labs and clinical decision  tools ie heart score, Chads2Vasc2 etc:1}  {Document your independent review of radiology images, and any outside records:1} {Document your discussion with family members, caretakers, and with consultants:1} {Document social determinants of health affecting pt's care:1} {Document your decision making why or why not admission, treatments were needed:1} Final Clinical Impression(s) / ED Diagnoses Final diagnoses:  None    Rx / DC Orders ED Discharge Orders     None

## 2022-09-07 NOTE — ED Provider Triage Note (Signed)
Emergency Medicine Provider Triage Evaluation Note  Crystal Villegas , a 36 y.o. female  was evaluated in triage.  Pt complains of shortness of breath for weeks, but is ongoing for 34 years. Hx of asthma, out of her inhalers and nebulizers. Midline chest tightness.  Review of Systems  Positive: Chest tightness, SOB Negative: Fever, chills  Physical Exam  BP 107/85 (BP Location: Right Arm)   Pulse (!) 107   Temp 99.3 F (37.4 C) (Oral)   Resp 18   SpO2 100%  Gen:   Awake, no distress   Resp:  Normal effort  MSK:   Moves extremities without difficulty  Other:    Medical Decision Making  Medically screening exam initiated at 10:10 PM.  Appropriate orders placed.  Rodnisha White-Johnson was informed that the remainder of the evaluation will be completed by another provider, this initial triage assessment does not replace that evaluation, and the importance of remaining in the ED until their evaluation is complete.  Workup initiated   Su Monks, PA-C 09/07/22 2215

## 2022-09-08 MED ORDER — PREDNISONE 20 MG PO TABS: 40.0000 mg | ORAL_TABLET | Freq: Every day | ORAL | 0 refills | Status: DC

## 2022-09-08 MED ORDER — ALBUTEROL SULFATE HFA 108 (90 BASE) MCG/ACT IN AERS: 1.0000 | INHALATION_SPRAY | Freq: Four times a day (QID) | RESPIRATORY_TRACT | 0 refills | Status: AC | PRN

## 2022-09-08 MED ORDER — ALBUTEROL SULFATE (2.5 MG/3ML) 0.083% IN NEBU: 2.5000 mg | INHALATION_SOLUTION | Freq: Four times a day (QID) | RESPIRATORY_TRACT | 12 refills | Status: AC | PRN

## 2022-09-08 NOTE — ED Notes (Signed)
Pt yelling at this tech that she needs her breathing treatment and has not had it all day. This tech informed her that I cannot medicate her pt, and that her nurse will medicate her. Pt continues to get frustrated and states that we are not doing enough for her and she shouldve had her meds when she first got here.

## 2022-09-08 NOTE — ED Notes (Signed)
Pt refused EKG.

## 2022-09-08 NOTE — Discharge Instructions (Addendum)
Use 2 puffs of an albuterol inhaler every 4-6 hours as needed for wheezing and shortness of breath. Take prednisone as prescribed until finished. Follow up with a primary care doctor.  There is a medical supply store at [employer] in [place], but also many other scattered around the city which can be researched online.

## 2023-01-10 ENCOUNTER — Encounter: Payer: Self-pay | Admitting: Cardiovascular Disease

## 2023-08-07 ENCOUNTER — Encounter: Payer: Self-pay | Admitting: Family

## 2023-08-15 ENCOUNTER — Ambulatory Visit: Payer: Medicaid Other | Attending: Cardiology

## 2023-08-15 ENCOUNTER — Ambulatory Visit: Payer: Medicaid Other | Attending: Cardiology | Admitting: Cardiology

## 2023-08-15 ENCOUNTER — Encounter: Payer: Self-pay | Admitting: Cardiology

## 2023-08-15 VITALS — BP 116/88 | HR 63 | Ht 60.0 in | Wt 131.0 lb

## 2023-08-15 DIAGNOSIS — R002 Palpitations: Secondary | ICD-10-CM | POA: Diagnosis not present

## 2023-08-15 DIAGNOSIS — R079 Chest pain, unspecified: Secondary | ICD-10-CM

## 2023-08-15 DIAGNOSIS — R0602 Shortness of breath: Secondary | ICD-10-CM

## 2023-08-15 DIAGNOSIS — I10 Essential (primary) hypertension: Secondary | ICD-10-CM

## 2023-08-15 NOTE — Progress Notes (Unsigned)
Enrolled patient for a 7 day Zio XT monitor to be mailed to patients home.  

## 2023-08-15 NOTE — Patient Instructions (Signed)
Medication Instructions:  No medication changes *If you need a refill on your cardiac medications before your next appointment, please call your pharmacy*   Lab Work: NONE If you have labs (blood work) drawn today and your tests are completely normal, you will receive your results only by: MyChart Message (if you have MyChart) OR A paper copy in the mail If you have any lab test that is abnormal or we need to change your treatment, we will call you to review the results.   Testing/Procedures: Your physician has requested that you have an echocardiogram. Echocardiography is a painless test that uses sound waves to create images of your heart. It provides your doctor with information about the size and shape of your heart and how well your heart's chambers and valves are working. This procedure takes approximately one hour. There are no restrictions for this procedure. Please do NOT wear perfume or lotions (deodorant is allowed).    Please arrive 15 minutes prior to your appointment time.    Please note: We ask at that you not bring children with you during ultrasound (echo/ vascular) testing. Due to room size and safety concerns, children are not allowed in the ultrasound rooms during exams. Our front office staff cannot provide observation of children in our lobby area while testing is being conducted. An adult accompanying a patient to their appointment will only be allowed in the ultrasound room at the discretion of the ultrasound technician under special circumstances. We apologize for any inconvenience.   ZIO XT- Long Term Monitor Instructions   Your physician has requested you wear your ZIO patch monitor___7___days.   This is a single patch monitor.  [employer] supplies one patch monitor per enrollment.  Additional stickers are not available.   Please do not apply patch if you will be having a Nuclear Stress Test, Echocardiogram, Cardiac CT, MRI, or Chest Xray during the time frame  you would be wearing the monitor. The patch cannot be worn during these tests.  You cannot remove and re-apply the ZIO XT patch monitor.   Your ZIO patch monitor will be sent USPS Priority mail from [facility] directly to your home address. The monitor may also be mailed to a PO BOX if home delivery is not available.   It may take 3-5 days to receive your monitor after you have been enrolled.   Once you have received you monitor, please review enclosed instructions.  Your monitor has already been registered assigning a specific monitor serial # to you.   Applying the monitor   Shave hair from upper left chest.   Hold abrader disc by orange tab.  Rub abrader in 40 strokes over left upper chest as indicated in your monitor instructions.   Clean area with 4 enclosed alcohol pads .  Use all pads to assure are is cleaned thoroughly.  Let dry.   Apply patch as indicated in monitor instructions.  Patch will be place under collarbone on left side of chest with arrow pointing upward.   Rub patch adhesive wings for 2 minutes.Remove white label marked "1".  Remove white label marked "2".  Rub patch adhesive wings for 2 additional minutes.   While looking in a mirror, press and release button in center of patch.  A small green light will flash 3-4 times .  This will be your only indicator the monitor has been turned on.     Do not shower for the first 24 hours.  You may shower after  the first 24 hours.   Press button if you feel a symptom. You will hear a small click.  Record Date, Time and Symptom in the Patient Log Book.   When you are ready to remove patch, follow instructions on last 2 pages of Patient Log Book.  Stick patch monitor onto last page of Patient Log Book.   Place Patient Log Book in [place] box.  Use locking tab on box and tape box closed securely.  The [employer] and [employer] has [employer] on it.  Please place in mailbox as soon as possible.  Your physician should have your  test results approximately 7 days after the monitor has been mailed back to [facility].   Call [facility] Customer Care at [phone] if you have questions regarding your ZIO XT patch monitor.  Call them immediately if you see an orange light blinking on your monitor.   If your monitor falls off in less than 4 days contact our Monitor department at [phone].  If your monitor becomes loose or falls off after 4 days call [employer] at [phone] for suggestions on securing your monitor.     Follow-Up: At [facility], you and your health needs are our priority.  As part of our continuing mission to provide you with exceptional heart care, we have created designated Provider Care Teams.  These Care Teams include your primary Cardiologist (physician) and Advanced Practice Providers (APPs -  Physician Assistants and Nurse Practitioners) who all work together to provide you with the care you need, when you need it.  We recommend signing up for the patient portal called "MyChart".  Sign up information is provided on this After Visit Summary.  MyChart is used to connect with patients for Virtual Visits (Telemedicine).  Patients are able to view lab/test results, encounter notes, upcoming appointments, etc.  Non-urgent messages can be sent to your provider as well.   To learn more about what you can do with MyChart, go to [email or web address].    Your next appointment:   4 month(s)  Provider:   Dr. Epifanio Lesches

## 2023-08-18 DIAGNOSIS — R002 Palpitations: Secondary | ICD-10-CM | POA: Diagnosis not present

## 2023-09-24 ENCOUNTER — Ambulatory Visit (HOSPITAL_COMMUNITY): Payer: Medicaid Other | Attending: Cardiology

## 2023-09-29 ENCOUNTER — Encounter (HOSPITAL_COMMUNITY): Payer: Self-pay | Admitting: Cardiology

## 2023-12-30 ENCOUNTER — Ambulatory Visit: Payer: Medicaid Other | Admitting: Cardiology

## 2024-01-06 NOTE — Progress Notes (Unsigned)
 Cardiology Office Note:    Date:  01/07/2024   ID:  Crystal Villegas, DOB [birth year], MRN [id number]  PCP:  Crystal Baller, FNP    HeartCare Providers Cardiologist:  Crystal Ishikawa, MD     Referring MD: Crystal Villegas,*   Chief Complaint  Patient presents with   Follow-up    Palpitations, CP of uncertain etiology    History of Present Illness:    Crystal Villegas is a 38 y.o. female with a hx of asthma, polysubstance abuse, HTN, conversion disorder, and seizures. She was referred to cardiology for evaluation of chest pain and palpitations. CP felt  noncardiac,  no ischemic evaluation was pursued. Zio patach was ordered and showed no arrhythmias. Echo was ordered by not completed.  When last seen by Dr. Bjorn Pippin 08/2023, no longer taking cardizem 120 mg and was normotensive.   She presents for 4 month follow up. She continues to struggle with pain from her left ribs to her chest. This is most bothersome at night when she is trying to sleep. Pain changes by moving around - she can typically change positions to ease the pain coming from the bottom of her left rib cage. She works at [employer] - on her feet. She can experience CP at work. She exercises at home without chest pain. CP exacerbated by certain movements.    Past Medical History:  Diagnosis Date   Asthma    Conversion disorder 2018   hospitalized in 2018 for neurological complaints, two different neurologists felt symptoms were likely conversion disorder   Hypertension    Polysubstance abuse (HCC)    Unclear whether this is an ongoing issue, but hx of multiple using multiple illicit substances   Scoliosis    Seizures (HCC)     Past Surgical History:  Procedure Laterality Date   BACK SURGERY     TUBAL LIGATION      Current Medications: Current Meds  Medication Sig   ADVAIR DISKUS 250-50 MCG/DOSE AEPB Inhale 1 puff into the lungs 2 (two) times daily.   albuterol (PROVENTIL)  (2.5 MG/3ML) 0.083% nebulizer solution Take 3 mLs (2.5 mg total) by nebulization every 6 (six) hours as needed for wheezing or shortness of breath.   albuterol (VENTOLIN HFA) 108 (90 Base) MCG/ACT inhaler Inhale 1-2 puffs into the lungs every 6 (six) hours as needed for wheezing or shortness of breath.   Multiple Vitamins-Calcium (ONE-A-DAY WOMENS FORMULA) TABS Take 1 tablet by mouth daily.     Allergies:   Patient has no known allergies.   Social History   Socioeconomic History   Marital status: Significant Other    Spouse name: Not on file   Number of children: Not on file   Years of education: Not on file   Highest education level: Not on file  Occupational History   Not on file  Tobacco Use   Smoking status: Former    Current packs/day: 0.00    Types: Cigarettes    Quit date: 05/28/2017    Years since quitting: 6.6   Smokeless tobacco: Former    Quit date: 05/28/2017  Vaping Use   Vaping status: Never Used  Substance and Sexual Activity   Alcohol use: Yes   Drug use: Not Currently    Types: Cocaine    Comment: hx of polysubstance abuse   Sexual activity: Yes  Other Topics Concern   Not on file  Social History Narrative   Not on file   Social Drivers of Health  Financial Resource Strain: Not on file  Food Insecurity: Not on file  Transportation Needs: Not on file  Physical Activity: Not on file  Stress: Not on file  Social Connections: Unknown (04/02/2023)   Received from [facility]   Social Network    Social Network: Not on file     Family History: The patient's Family history is unknown by patient.  ROS:   Please see the history of present illness.     All other systems reviewed and are negative.  EKGs/Labs/Other Studies Reviewed:    The following studies were reviewed today:  Echo pending      Recent Labs: No results found for requested labs within last 365 days.  Recent Lipid Panel No results found for: "CHOL", "TRIG", "HDL", "CHOLHDL",  "VLDL", "LDLCALC", "LDLDIRECT"   Risk Assessment/Calculations:                Physical Exam:    VS:  BP 111/75   Pulse 78   Ht 5' (1.524 m)   Wt 120 lb (54.4 kg)   BMI 23.44 kg/m     Wt Readings from Last 3 Encounters:  01/07/24 120 lb (54.4 kg)  08/15/23 131 lb (59.4 kg)  05/04/19 105 lb 13.1 oz (48 kg)     GEN:  Well nourished, well developed in no acute distress HEENT: Normal NECK: No JVD; No carotid bruits LYMPHATICS: No lymphadenopathy CARDIAC: RRR, no murmurs, rubs, gallops RESPIRATORY:  Clear to auscultation without rales, wheezing or rhonchi  ABDOMEN: Soft, non-tender, non-distended MUSCULOSKELETAL:  No edema; No deformity  SKIN: Warm and dry NEUROLOGIC:  Alert and oriented x 3 PSYCHIATRIC:  Normal affect   ASSESSMENT:    1. Primary hypertension   2. Palpitations   3. Chest pain of uncertain etiology    PLAN:    In order of problems listed above:  Hypertension - previously on 120 mg cardizem - not taking any anti-hypertensives - normotensive today   Chest pain of uncertain etiology - CP sounds atypical - will proceed with previously ordered echocardiogram   Palpitations - zio unrevealing - palpitations seem to have decreased - mostly during stressful times at work   Follow up in 3-4 months with Dr. Bjorn Pippin.      Medication Adjustments/Labs and Tests Ordered: Current medicines are reviewed at length with the patient today.  Concerns regarding medicines are outlined above.  No orders of the defined types were placed in this encounter.  No orders of the defined types were placed in this encounter.   Patient Instructions  Medication Instructions:  Your physician recommends that you continue on your current medications as directed. Please refer to the Current Medication list given to you today.  *If you need a refill on your cardiac medications before your next appointment, please call your pharmacy*   Lab Work: NONE ordered at this  time of appointment   Testing/Procedures: Schedule echocardiogram. (Order previously placed)   Follow-Up: At [facility], you and your health needs are our priority.  As part of our continuing mission to provide you with exceptional heart care, we have created designated Provider Care Teams.  These Care Teams include your primary Cardiologist (physician) and Advanced Practice Providers (APPs -  Physician Assistants and Nurse Practitioners) who all work together to provide you with the care you need, when you need it.  We recommend signing up for the patient portal called "MyChart".  Sign up information is provided on this After Visit Summary.  MyChart is  used to connect with patients for Virtual Visits (Telemedicine).  Patients are able to view lab/test results, encounter notes, upcoming appointments, etc.  Non-urgent messages can be sent to your provider as well.   To learn more about what you can do with MyChart, go to [email or web address].    Your next appointment:   3-4 month(s)  Provider:   Little Ishikawa, MD     Other Instructions   1st Floor: - Lobby - Registration  - Pharmacy  - Lab - Cafe  2nd Floor: - PV Lab - Diagnostic Testing (echo, CT, nuclear med)  3rd Floor: - Vacant  4th Floor: - TCTS (cardiothoracic surgery) - AFib Clinic - Structural Heart Clinic - Vascular Surgery  - Vascular Ultrasound  5th Floor: - HeartCare Cardiology (general and EP) - Clinical Pharmacy for coumadin, hypertension, lipid, weight-loss medications, and med management appointments    Valet parking services will be available as well.         Signed, Marcelino Duster, [place]  01/07/2024 2:24 PM    [place] [facility]

## 2024-01-07 ENCOUNTER — Ambulatory Visit: Attending: Physician Assistant | Admitting: Physician Assistant

## 2024-01-07 ENCOUNTER — Encounter: Payer: Self-pay | Admitting: Physician Assistant

## 2024-01-07 VITALS — BP 111/75 | HR 78 | Ht 60.0 in | Wt 120.0 lb

## 2024-01-07 DIAGNOSIS — I10 Essential (primary) hypertension: Secondary | ICD-10-CM | POA: Diagnosis not present

## 2024-01-07 DIAGNOSIS — R002 Palpitations: Secondary | ICD-10-CM | POA: Diagnosis not present

## 2024-01-07 DIAGNOSIS — R079 Chest pain, unspecified: Secondary | ICD-10-CM | POA: Diagnosis not present

## 2024-01-07 NOTE — Addendum Note (Signed)
 Addended by: Lamar Benes on: 01/07/2024 02:27 PM   Modules accepted: Orders

## 2024-01-07 NOTE — Patient Instructions (Signed)
 Medication Instructions:  Your physician recommends that you continue on your current medications as directed. Please refer to the Current Medication list given to you today.  *If you need a refill on your cardiac medications before your next appointment, please call your pharmacy*   Lab Work: NONE ordered at this time of appointment   Testing/Procedures: Schedule echocardiogram. (Order previously placed)   Follow-Up: At [facility], you and your health needs are our priority.  As part of our continuing mission to provide you with exceptional heart care, we have created designated Provider Care Teams.  These Care Teams include your primary Cardiologist (physician) and Advanced Practice Providers (APPs -  Physician Assistants and Nurse Practitioners) who all work together to provide you with the care you need, when you need it.  We recommend signing up for the patient portal called "MyChart".  Sign up information is provided on this After Visit Summary.  MyChart is used to connect with patients for Virtual Visits (Telemedicine).  Patients are able to view lab/test results, encounter notes, upcoming appointments, etc.  Non-urgent messages can be sent to your provider as well.   To learn more about what you can do with MyChart, go to [email or web address].    Your next appointment:   3-4 month(s)  Provider:   Little Ishikawa, MD     Other Instructions   1st Floor: - Lobby - Registration  - Pharmacy  - Lab - Cafe  2nd Floor: - PV Lab - Diagnostic Testing (echo, CT, nuclear med)  3rd Floor: - Vacant  4th Floor: - TCTS (cardiothoracic surgery) - AFib Clinic - Structural Heart Clinic - Vascular Surgery  - Vascular Ultrasound  5th Floor: - HeartCare Cardiology (general and EP) - Clinical Pharmacy for coumadin, hypertension, lipid, weight-loss medications, and med management appointments    Valet parking services will be available as well.

## 2024-01-10 ENCOUNTER — Ambulatory Visit (INDEPENDENT_AMBULATORY_CARE_PROVIDER_SITE_OTHER)

## 2024-01-10 ENCOUNTER — Encounter (HOSPITAL_COMMUNITY): Payer: Self-pay | Admitting: *Deleted

## 2024-01-10 ENCOUNTER — Other Ambulatory Visit: Payer: Self-pay

## 2024-01-10 ENCOUNTER — Ambulatory Visit (HOSPITAL_COMMUNITY)
Admission: EM | Admit: 2024-01-10 | Discharge: 2024-01-10 | Disposition: A | Discharge status: Home / Self Care | Attending: Emergency Medicine | Admitting: Emergency Medicine

## 2024-01-10 DIAGNOSIS — B349 Viral infection, unspecified: Secondary | ICD-10-CM | POA: Diagnosis present

## 2024-01-10 DIAGNOSIS — R051 Acute cough: Secondary | ICD-10-CM

## 2024-01-10 DIAGNOSIS — R072 Precordial pain: Secondary | ICD-10-CM | POA: Diagnosis present

## 2024-01-10 DIAGNOSIS — R112 Nausea with vomiting, unspecified: Secondary | ICD-10-CM | POA: Insufficient documentation

## 2024-01-10 DIAGNOSIS — R0602 Shortness of breath: Secondary | ICD-10-CM | POA: Insufficient documentation

## 2024-01-10 LAB — BASIC METABOLIC PANEL WITH GFR
Anion gap: 9 (ref 5–15)
BUN: 8 mg/dL (ref 6–20)
CO2: 25 mmol/L (ref 22–32)
Calcium: 10.1 mg/dL (ref 8.9–10.3)
Chloride: 103 mmol/L (ref 98–111)
Creatinine, Ser: 0.91 mg/dL (ref 0.44–1.00)
GFR, Estimated: >60 mL/min (ref >60)
Glucose, Bld: 95 mg/dL (ref 70–99)
Potassium: 3.9 mmol/L (ref 3.5–5.1)
Sodium: 137 mmol/L (ref 135–145)

## 2024-01-10 LAB — CBC
HCT: 38.3 % (ref 36.0–46.0)
Hemoglobin: 12.5 g/dL (ref 12.0–15.0)
MCH: 30.7 pg (ref 26.0–34.0)
MCHC: 32.6 g/dL (ref 30.0–36.0)
MCV: 94.1 fL (ref 80.0–100.0)
Platelets: 407 10*3/uL — ABNORMAL HIGH (ref 150–400)
RBC: 4.07 MIL/uL (ref 3.87–5.11)
RDW: 14.3 % (ref 11.5–15.5)
WBC: 5.3 10*3/uL (ref 4.0–10.5)
nRBC: 0 % (ref 0.0–0.2)

## 2024-01-10 MED ORDER — ONDANSETRON 4 MG PO TBDP: 4.0000 mg | ORAL_TABLET | Freq: Three times a day (TID) | ORAL | 0 refills | Status: AC | PRN

## 2024-01-10 MED ORDER — ONDANSETRON 4 MG PO TBDP
4.0000 mg | ORAL_TABLET | Freq: Once | ORAL | Status: AC
  Administered 2024-01-10: 4 mg via ORAL

## 2024-01-10 MED ORDER — ONDANSETRON 4 MG PO TBDP
ORAL_TABLET | ORAL | Status: AC
  Filled 2024-01-10: qty 1

## 2024-01-10 MED ORDER — BENZONATATE 100 MG PO CAPS: 100.0000 mg | ORAL_CAPSULE | Freq: Three times a day (TID) | ORAL | 0 refills | Status: AC

## 2024-01-10 NOTE — Discharge Instructions (Signed)
 As discussed I believe your symptoms are likely related to a viral illness.  I have prescribed Zofran that you can take every 8 hours as needed for nausea and vomiting.  You received a dose of this today around 3:30 pm, so the next time you can take this is 11:30 PM if needed.  I have also prescribed Tessalon that you can take every 8 hours as needed for cough.  You can also take Mucinex as needed for cough and congestion.  You can continue to use your albuterol inhaler as needed for shortness of breath.  We have drawn some labs today.  If your results come back and they are concerning you will receive a phone call advising him next times.  Otherwise make sure you are staying hydrated and getting some rest.  Return here for symptoms persist or worsen.

## 2024-01-10 NOTE — ED Triage Notes (Signed)
 PT reports vomiting and multiple complaints.

## 2024-01-10 NOTE — ED Notes (Signed)
Pt given a cup of water to drink. 

## 2024-01-10 NOTE — ED Provider Notes (Signed)
 [facility]    CSN: [id number] Arrival date & time: 01/10/24  1337      History   Chief Complaint Chief Complaint  Patient presents with   Emesis   Fever    HPI Crystal Villegas is a 38 y.o. female.   Patient presents feeling unwell x2 weeks.   Patient states about 2 weeks ago she began to feel weak and felt feverish. Patient states that she then developed cough, congestion, headache, intermittent dizziness, body aches, and chills.   Patient states about 2-3 days ago she began vomiting and states that she has had trouble keeping down food. Denies diarrhea and abdominal pain.  Patient also endorses some mild intermittent shortness of breath. Reports history of asthma and states that she has been using her albuterol inhaler with relief of shortness of breath.   Patient now endorses left sided chest pain that radiates to there left arm. Patient states that pain worsens with deep breathing and movement.   Patient denies taking any mediations other than albuterol for her symptoms.     Emesis Associated symptoms: fever   Fever Associated symptoms: vomiting     Past Medical History:  Diagnosis Date   Asthma    Conversion disorder 2018   hospitalized in 2018 for neurological complaints, two different neurologists felt symptoms were likely conversion disorder   Hypertension    Polysubstance abuse (HCC)    Unclear whether this is an ongoing issue, but hx of multiple using multiple illicit substances   Scoliosis    Seizures (HCC)     Patient Active Problem List   Diagnosis Date Noted   Asthma exacerbation 05/04/2019   HTN (hypertension) 06/21/2017   Asthma 06/21/2017   Muscle weakness 06/21/2017   Seizure (HCC) 06/12/2017    Past Surgical History:  Procedure Laterality Date   BACK SURGERY     TUBAL LIGATION      OB History   No obstetric history on file.      Home Medications    Prior to Admission medications   Medication Sig Start Date  End Date Taking? Authorizing Provider  benzonatate (TESSALON) 100 MG capsule Take 1 capsule (100 mg total) by mouth every 8 (eight) hours. 01/10/24  Yes Susann Givens, Makelle Marrone A, NP  ondansetron (ZOFRAN-ODT) 4 MG disintegrating tablet Take 1 tablet (4 mg total) by mouth every 8 (eight) hours as needed for nausea or vomiting. 01/10/24  Yes Wynonia Lawman A, NP  ADVAIR DISKUS 250-50 MCG/DOSE AEPB Inhale 1 puff into the lungs 2 (two) times daily. 05/05/19   Adrian Saran, MD  albuterol (PROVENTIL) (2.5 MG/3ML) 0.083% nebulizer solution Take 3 mLs (2.5 mg total) by nebulization every 6 (six) hours as needed for wheezing or shortness of breath. 09/08/22   Antony Madura, PA-C  albuterol (VENTOLIN HFA) 108 (90 Base) MCG/ACT inhaler Inhale 1-2 puffs into the lungs every 6 (six) hours as needed for wheezing or shortness of breath. 09/08/22   Antony Madura, PA-C  aspirin EC 81 MG tablet Take 81 mg by mouth daily. Patient not taking: Reported on 01/07/2024    [provider]  diltiazem (CARTIA XT) 120 MG 24 hr capsule Take 120 mg by mouth daily. Patient not taking: Reported on 01/07/2024 05/21/18   [provider]  lamoTRIgine (LAMICTAL) 25 MG tablet Take 50 mg by mouth at bedtime. Patient not taking: Reported on 01/07/2024    [provider]  lamoTRIgine (LAMICTAL) 25 MG tablet Take 50 mg by mouth 2 (two) times a  day. Patient not taking: Reported on 01/07/2024 04/28/18   [provider]  Multiple Vitamins-Calcium (ONE-A-DAY WOMENS FORMULA) TABS Take 1 tablet by mouth daily.    [provider]    Family History Family History  Family history unknown: Yes    Social History Social History   Tobacco Use   Smoking status: Former    Current packs/day: 0.00    Types: Cigarettes    Quit date: 05/28/2017    Years since quitting: 6.6   Smokeless tobacco: Former    Quit date: 05/28/2017  Vaping Use   Vaping status: Never Used  Substance Use Topics   Alcohol use: Yes   Drug  use: Not Currently    Types: Cocaine    Comment: hx of polysubstance abuse     Allergies   Patient has no known allergies.   Review of Systems Review of Systems  Constitutional:  Positive for fever.  Gastrointestinal:  Positive for vomiting.   Per HPI  Physical Exam Triage Vital Signs ED Triage Vitals  Encounter Vitals Group     BP 01/10/24 1413 117/86     Systolic BP Percentile --      Diastolic BP Percentile --      Pulse Rate 01/10/24 1413 90     Resp 01/10/24 1413 18     Temp 01/10/24 1413 98.7 F (37.1 C)     Temp src --      SpO2 01/10/24 1413 98 %     Weight --      Height --      Head Circumference --      Peak Flow --      Pain Score 01/10/24 1411 8     Pain Loc --      Pain Education --      Exclude from Growth Chart --    No data found.  Updated Vital Signs BP 117/86   Pulse 90   Temp 98.7 F (37.1 C)   Resp 18   LMP 01/01/2024   SpO2 98%   Visual Acuity Right Eye Distance:   Left Eye Distance:   Bilateral Distance:    Right Eye Near:   Left Eye Near:    Bilateral Near:     Physical Exam Vitals and nursing note reviewed.  Constitutional:      General: She is awake. She is not in acute distress.    Appearance: Normal appearance. She is well-developed and well-groomed. She is not ill-appearing.  HENT:     Right Ear: Tympanic membrane, ear canal and external ear normal.     Left Ear: Tympanic membrane, ear canal and external ear normal.     Nose: Congestion and rhinorrhea present.     Mouth/Throat:     Mouth: Mucous membranes are moist.     Pharynx: Posterior oropharyngeal erythema present. No oropharyngeal exudate.  Cardiovascular:     Rate and Rhythm: Normal rate and regular rhythm.  Pulmonary:     Effort: Pulmonary effort is normal.     Breath sounds: Normal breath sounds.  Musculoskeletal:        General: Normal range of motion.  Skin:    General: Skin is warm and dry.  Neurological:     Mental Status: She is alert.   Psychiatric:        Behavior: Behavior is cooperative.      UC Treatments / Results  Labs (all labs ordered are listed, but only abnormal results are displayed) Labs Reviewed  CBC  BASIC METABOLIC PANEL WITH GFR    EKG   Radiology DG Chest 2 View Result Date: 01/10/2024 CLINICAL DATA:  Shortness of breath EXAM: CHEST - 2 VIEW COMPARISON:  Chest radiograph 09/07/2022 FINDINGS: Stable cardiac and mediastinal contours. No large area pulmonary consolidation. No pleural effusion or pneumothorax. Spinal fusion rods. Scoliotic curvature. IMPRESSION: No active cardiopulmonary disease. Electronically Signed   By: Annia Belt M.D.   On: 01/10/2024 14:46    Procedures Procedures (including critical care time)  Medications Ordered in UC Medications  ondansetron (ZOFRAN-ODT) disintegrating tablet 4 mg (4 mg Oral Given 01/10/24 1534)    Initial Impression / Assessment and Plan / UC Course  I have reviewed the triage vital signs and the nursing notes.  Pertinent labs & imaging results that were available during my care of the patient were reviewed by me and considered in my medical decision making (see chart for details).     Upon assessment congestion and rhinorrhea are present, mild erythema noted to pharynx.  Lungs clear bilaterally to auscultation.  No other significant findings upon exam.  Chest x-ray ordered to rule out underlying pneumonia.  Based my interpretation there is no active cardiopulmonary disease.  Radiology report confirms this.  EKG revealed normal sinus rhythm without ST elevation, depression, or acute cardiac findings.  Ordered CBC and BMP to assess for underlying electrolyte imbalance.  Given Zofran in clinic with relief of nausea and patient was able to drink a full cup of water without difficulty.  Prescribe Zofran as needed for nausea and vomiting.  Prescribed Tessalon as needed for cough.  Discussed follow-up and return precautions. Final Clinical  Impressions(s) / UC Diagnoses   Final diagnoses:  Acute cough  Nausea and vomiting, unspecified vomiting type  Viral illness  Shortness of breath  Precordial pain     Discharge Instructions      As discussed I believe your symptoms are likely related to a viral illness.  I have prescribed Zofran that you can take every 8 hours as needed for nausea and vomiting.  You received a dose of this today around 3:30 pm, so the next time you can take this is 11:30 PM if needed.  I have also prescribed Tessalon that you can take every 8 hours as needed for cough.  You can also take Mucinex as needed for cough and congestion.  You can continue to use your albuterol inhaler as needed for shortness of breath.  We have drawn some labs today.  If your results come back and they are concerning you will receive a phone call advising him next times.  Otherwise make sure you are staying hydrated and getting some rest.  Return here for symptoms persist or worsen.     ED Prescriptions     Medication Sig Dispense Auth. Provider   ondansetron (ZOFRAN-ODT) 4 MG disintegrating tablet Take 1 tablet (4 mg total) by mouth every 8 (eight) hours as needed for nausea or vomiting. 10 tablet Wynonia Lawman A, NP   benzonatate (TESSALON) 100 MG capsule Take 1 capsule (100 mg total) by mouth every 8 (eight) hours. 21 capsule Wynonia Lawman A, NP      PDMP not reviewed this encounter.   Wynonia Lawman A, NP 01/10/24 [phone]

## 2024-01-28 ENCOUNTER — Emergency Department (HOSPITAL_COMMUNITY)
Admission: EM | Admit: 2024-01-28 | Discharge: 2024-01-28 | Discharge status: Against Medical Advice | Attending: Emergency Medicine | Admitting: Emergency Medicine

## 2024-01-28 ENCOUNTER — Other Ambulatory Visit: Payer: Self-pay

## 2024-01-28 ENCOUNTER — Encounter (HOSPITAL_COMMUNITY): Payer: Self-pay

## 2024-01-28 ENCOUNTER — Ambulatory Visit (HOSPITAL_COMMUNITY)
Admission: EM | Admit: 2024-01-28 | Discharge: 2024-01-28 | Disposition: A | Discharge status: Home / Self Care | Attending: Emergency Medicine | Admitting: Emergency Medicine

## 2024-01-28 ENCOUNTER — Encounter (HOSPITAL_COMMUNITY): Payer: Self-pay | Admitting: Emergency Medicine

## 2024-01-28 DIAGNOSIS — Z5321 Procedure and treatment not carried out due to patient leaving prior to being seen by health care provider: Secondary | ICD-10-CM | POA: Insufficient documentation

## 2024-01-28 DIAGNOSIS — N939 Abnormal uterine and vaginal bleeding, unspecified: Secondary | ICD-10-CM | POA: Diagnosis present

## 2024-01-28 LAB — URINALYSIS, ROUTINE W REFLEX MICROSCOPIC
Bilirubin Urine: NEGATIVE
Glucose, UA: NEGATIVE mg/dL
Ketones, ur: NEGATIVE mg/dL
Nitrite: NEGATIVE
Protein, ur: NEGATIVE mg/dL
Specific Gravity, Urine: 1.004 — ABNORMAL LOW (ref 1.005–1.030)
pH: 5 (ref 5.0–8.0)

## 2024-01-28 LAB — CBC
HCT: 38.6 % (ref 36.0–46.0)
Hemoglobin: 12.8 g/dL (ref 12.0–15.0)
MCH: 30.7 pg (ref 26.0–34.0)
MCHC: 33.2 g/dL (ref 30.0–36.0)
MCV: 92.6 fL (ref 80.0–100.0)
Platelets: 315 10*3/uL (ref 150–400)
RBC: 4.17 MIL/uL (ref 3.87–5.11)
RDW: 13.6 % (ref 11.5–15.5)
WBC: 5.1 10*3/uL (ref 4.0–10.5)
nRBC: 0 % (ref 0.0–0.2)

## 2024-01-28 LAB — HCG, SERUM, QUALITATIVE: Preg, Serum: NEGATIVE

## 2024-01-28 NOTE — ED Triage Notes (Signed)
 Patient coming to ED for evaluation of lower abdominal cramping and vaginal bleeding.  Reports she had a normal cycle in March.  States "it went off.  Then my husband started messing with his ex around the 20th.  On the 25th I started having the spotting.  It is only when I wipe, but every time I wiped there was blood."  No reports of dysuria or discharge.  LMP started "today like normal."

## 2024-01-28 NOTE — ED Notes (Signed)
 Called for patient no answer

## 2024-01-28 NOTE — ED Notes (Signed)
 Pt turned in stickers and was seen leaving ED

## 2024-01-28 NOTE — ED Triage Notes (Addendum)
 Pt presents to urgent care for lower abdominal pain and vag spotting since 01/06/2024. Patient denies any fever or dysuria.  Urine specimen and cyto collected.

## 2024-01-28 NOTE — Discharge Instructions (Addendum)
 Today you have been screened for vaginal infections.  Our staff will contact you if the results are abnormal.  It is important that you follow-up with OB/GYN if you have any continued vaginal concerns.  Seek immediate care at the nearest emergency department if you develop fever, severe pelvic pain, or any new concerning symptoms.

## 2024-01-28 NOTE — ED Provider Notes (Signed)
 [facility]    CSN: [id number] Arrival date & time: 01/28/24  1339      History   Chief Complaint Chief Complaint  Patient presents with   Vaginal Bleeding   Abdominal Cramping    HPI Crystal Villegas is a 38 y.o. female.   Patient presents to clinic over concerns of vaginal spotting.  She is having lower abdominal discomfort and notices some spots along her belt and jeans from her C-section scar, this has been ongoing.  She started spotting on March 25 and today she started her full menstrual cycle.  Patient eloped from a local emergency department.  Urinalysis at this visit showed red blood cells and leukocytes.  CBC did not show any acute abnormalities to the hemoglobin.  hCG was negative.  ED note mentions some infidelity by her husband.  Patient reports she is not concerned for sexually transmitted infections.  Denies dysuria, urgency or frequency.  She does not have an OB/GYN.  Some pelvic discomfort.  The history is provided by the patient and medical records.  Vaginal Bleeding Abdominal Cramping    Past Medical History:  Diagnosis Date   Asthma    Conversion disorder 2018   hospitalized in 2018 for neurological complaints, two different neurologists felt symptoms were likely conversion disorder   Hypertension    Polysubstance abuse (HCC)    Unclear whether this is an ongoing issue, but hx of multiple using multiple illicit substances   Scoliosis    Seizures (HCC)     Patient Active Problem List   Diagnosis Date Noted   Asthma exacerbation 05/04/2019   HTN (hypertension) 06/21/2017   Asthma 06/21/2017   Muscle weakness 06/21/2017   Seizure (HCC) 06/12/2017    Past Surgical History:  Procedure Laterality Date   BACK SURGERY     TUBAL LIGATION      OB History   No obstetric history on file.      Home Medications    Prior to Admission medications   Medication Sig Start Date End Date Taking? Authorizing Provider  ADVAIR DISKUS  250-50 MCG/DOSE AEPB Inhale 1 puff into the lungs 2 (two) times daily. 05/05/19  Yes Mody, Alexia Angelucci, MD  albuterol (PROVENTIL) (2.5 MG/3ML) 0.083% nebulizer solution Take 3 mLs (2.5 mg total) by nebulization every 6 (six) hours as needed for wheezing or shortness of breath. 09/08/22  Yes Carleton Cheek, PA-C  albuterol (VENTOLIN HFA) 108 (90 Base) MCG/ACT inhaler Inhale 1-2 puffs into the lungs every 6 (six) hours as needed for wheezing or shortness of breath. 09/08/22  Yes Carleton Cheek, PA-C  diltiazem (CARTIA XT) 120 MG 24 hr capsule Take 120 mg by mouth daily. 05/21/18  Yes [provider]  aspirin EC 81 MG tablet Take 81 mg by mouth daily. Patient not taking: Reported on 01/07/2024    [provider]  benzonatate (TESSALON) 100 MG capsule Take 1 capsule (100 mg total) by mouth every 8 (eight) hours. 01/10/24   Levora Reas A, NP  lamoTRIgine (LAMICTAL) 25 MG tablet Take 50 mg by mouth at bedtime. Patient not taking: Reported on 01/07/2024    [provider]  lamoTRIgine (LAMICTAL) 25 MG tablet Take 50 mg by mouth 2 (two) times a day. Patient not taking: Reported on 01/07/2024 04/28/18   [provider]  Multiple Vitamins-Calcium (ONE-A-DAY WOMENS FORMULA) TABS Take 1 tablet by mouth daily.    [provider]  ondansetron (ZOFRAN-ODT) 4 MG disintegrating tablet Take 1 tablet (4 mg total) by mouth  every 8 (eight) hours as needed for nausea or vomiting. 01/10/24   Letta Kocher, NP    Family History Family History  Family history unknown: Yes    Social History Social History   Tobacco Use   Smoking status: Former    Current packs/day: 0.00    Types: Cigarettes    Quit date: 05/28/2017    Years since quitting: 6.6   Smokeless tobacco: Former    Quit date: 05/28/2017  Vaping Use   Vaping status: Never Used  Substance Use Topics   Alcohol use: Yes   Drug use: Not Currently    Types: Cocaine    Comment: hx of polysubstance abuse      Allergies   Patient has no known allergies.   Review of Systems Review of Systems  Per HPI  Physical Exam Triage Vital Signs ED Triage Vitals [01/28/24 1526]  Encounter Vitals Group     BP (!) 159/91     Systolic BP Percentile      Diastolic BP Percentile      Pulse Rate 70     Resp 18     Temp 98.5 F (36.9 C)     Temp Source Oral     SpO2 95 %     Weight      Height      Head Circumference      Peak Flow      Pain Score      Pain Loc      Pain Education      Exclude from Growth Chart    No data found.  Updated Vital Signs BP (!) 159/91 (BP Location: Left Arm)   Pulse 70   Temp 98.5 F (36.9 C) (Oral)   Resp 18   LMP 01/28/2024   SpO2 95%   Visual Acuity Right Eye Distance:   Left Eye Distance:   Bilateral Distance:    Right Eye Near:   Left Eye Near:    Bilateral Near:     Physical Exam Vitals and nursing note reviewed.  Constitutional:      Appearance: Normal appearance.  HENT:     Head: Normocephalic and atraumatic.     Right Ear: External ear normal.     Left Ear: External ear normal.     Nose: Nose normal.     Mouth/Throat:     Mouth: Mucous membranes are moist.  Eyes:     Conjunctiva/sclera: Conjunctivae normal.  Cardiovascular:     Rate and Rhythm: Normal rate.  Pulmonary:     Effort: Pulmonary effort is normal. No respiratory distress.  Abdominal:     General: Abdomen is flat. Bowel sounds are normal.     Palpations: Abdomen is soft.     Tenderness: There is abdominal tenderness. There is no guarding or rebound.       Comments: Mild TTP over c-section scar, edges are not dehisced, w/o warmth, swelling or erythema   Musculoskeletal:        General: Normal range of motion.  Skin:    General: Skin is warm and dry.  Neurological:     General: No focal deficit present.     Mental Status: She is alert.  Psychiatric:        Mood and Affect: Mood normal.        Behavior: Behavior is cooperative.      UC Treatments /  Results  Labs (all labs ordered are listed, but only abnormal results are displayed) Labs  Reviewed  CERVICOVAGINAL ANCILLARY ONLY    EKG   Radiology No results found.  Procedures Procedures (including critical care time)  Medications Ordered in UC Medications - No data to display  Initial Impression / Assessment and Plan / UC Course  I have reviewed the triage vital signs and the nursing notes.  Pertinent labs & imaging results that were available during my care of the patient were reviewed by me and considered in my medical decision making (see chart for details).  Vitals in triage reviewed, patient is hemodynamically stable.  Abdomen is soft with active bowel sounds, mild lower abdominal tenderness especially around C-section scar.  Does not appear to be infected, no obvious drainage.  Patient declined GU exam.  Cytology swab obtained.  Staff will contact if treatment is indicated based on results.  Encouraged abstinence until results have been received.  Strict emergency precautions given if symptoms develop, encouraged GYN follow-up.     Final Clinical Impressions(s) / UC Diagnoses   Final diagnoses:  Vaginal bleeding     Discharge Instructions      Today you have been screened for vaginal infections.  Our staff will contact you if the results are abnormal.  It is important that you follow-up with OB/GYN if you have any continued vaginal concerns.  Seek immediate care at the nearest emergency department if you develop fever, severe pelvic pain, or any new concerning symptoms.     ED Prescriptions   None    PDMP not reviewed this encounter.   Bryson Carbine, FNP 01/28/24 1554

## 2024-01-29 ENCOUNTER — Telehealth (HOSPITAL_COMMUNITY): Payer: Self-pay

## 2024-01-29 LAB — CERVICOVAGINAL ANCILLARY ONLY
Bacterial Vaginitis (gardnerella): POSITIVE — AB
Candida Glabrata: NEGATIVE
Candida Vaginitis: NEGATIVE
Chlamydia: NEGATIVE
Comment: NEGATIVE
Comment: NEGATIVE
Comment: NEGATIVE
Comment: NEGATIVE
Comment: NEGATIVE
Comment: NORMAL
Neisseria Gonorrhea: NEGATIVE
Trichomonas: POSITIVE — AB

## 2024-01-29 MED ORDER — METRONIDAZOLE 500 MG PO TABS: 500.0000 mg | ORAL_TABLET | Freq: Two times a day (BID) | ORAL | 0 refills | Status: AC

## 2024-01-29 NOTE — Telephone Encounter (Signed)
 Per protocol, pt requires tx with metronidazole. Attempted to reach patient x1. LVM.  Rx sent to pharmacy on file.

## 2024-02-19 ENCOUNTER — Ambulatory Visit (HOSPITAL_COMMUNITY): Admission: RE | Admit: 2024-02-19 | Source: Ambulatory Visit

## 2024-02-24 ENCOUNTER — Encounter: Payer: Self-pay | Admitting: Physician Assistant

## 2024-04-08 ENCOUNTER — Ambulatory Visit (HOSPITAL_COMMUNITY)

## 2024-04-19 NOTE — Progress Notes (Unsigned)
 Cardiology Office Note:    Date:  04/21/2024   ID:  Crystal Villegas, DOB 1985-12-04, MRN [id number]  PCP:  Ezra Montie Aquas, FNP  Cardiologist:  Lonni LITTIE Nanas, MD  Electrophysiologist:  None   Referring MD: Ezra Montie Aquas,*   Chief Complaint  Patient presents with   Shortness of Breath    History of Present Illness:    Crystal Villegas is a 38 y.o. female with a hx of asthma, polysubstance abuse, hypertension, conversion disorder, seizures who presents for follow-up.  She was referred by Montie Ezra, NP for evaluation of hypervolemia and palpitations, initially seen 08/15/2023.   Zio patch x 7 days 08/2023 showed no significant arrhythmias.  Echocardiogram was ordered at initial clinic visit but was not done  Since last clinic visit, reports she is doing okay.  Reports continues to have palpitations at night but have improved during the day.  Reports she continues to have shortness of breath.  Also continues to report sharp pain in chest that occurs at rest, lasts minutes.  BP Readings from Last 3 Encounters:  04/21/24 112/74  01/28/24 (!) 159/91  01/28/24 (!) 126/97     Past Medical History:  Diagnosis Date   Asthma    Conversion disorder 2018   hospitalized in 2018 for neurological complaints, two different neurologists felt symptoms were likely conversion disorder   Hypertension    Polysubstance abuse (HCC)    Unclear whether this is an ongoing issue, but hx of multiple using multiple illicit substances   Scoliosis    Seizures (HCC)     Past Surgical History:  Procedure Laterality Date   BACK SURGERY     TUBAL LIGATION      Current Medications: Current Meds  Medication Sig   albuterol  (PROVENTIL ) (2.5 MG/3ML) 0.083% nebulizer solution Take 3 mLs (2.5 mg total) by nebulization every 6 (six) hours as needed for wheezing or shortness of breath.   albuterol  (VENTOLIN  HFA) 108 (90 Base) MCG/ACT inhaler Inhale 1-2 puffs into the  lungs every 6 (six) hours as needed for wheezing or shortness of breath.   Multiple Vitamins-Calcium (ONE-A-DAY WOMENS FORMULA) TABS Take 1 tablet by mouth daily.     Allergies:   Patient has no known allergies.   Social History   Socioeconomic History   Marital status: Married    Spouse name: Not on file   Number of children: Not on file   Years of education: Not on file   Highest education level: Not on file  Occupational History   Not on file  Tobacco Use   Smoking status: Former    Current packs/day: 0.00    Types: Cigarettes    Quit date: 05/28/2017    Years since quitting: 6.9   Smokeless tobacco: Former    Quit date: 05/28/2017  Vaping Use   Vaping status: Never Used  Substance and Sexual Activity   Alcohol use: Yes   Drug use: Not Currently    Types: Cocaine    Comment: hx of polysubstance abuse   Sexual activity: Yes  Other Topics Concern   Not on file  Social History Narrative   Not on file   Social Drivers of Health   Financial Resource Strain: Not on file  Food Insecurity: Not on file  Transportation Needs: Not on file  Physical Activity: Not on file  Stress: Not on file  Social Connections: Unknown (04/02/2023)   Received from [facility]   Social Network    Social Network: Not on  file     Family History: Family history includes paternal grandmother had MI in 49s.  ROS:   Please see the history of present illness.     All other systems reviewed and are negative.  EKGs/Labs/Other Studies Reviewed:    The following studies were reviewed today:   EKG:   08/15/23: Normal sinus rhythm, rate 63, no ST abnormalities  Recent Labs: 01/10/2024: BUN 8; Creatinine, Ser 0.91; Potassium 3.9; Sodium 137 01/28/2024: Hemoglobin 12.8; Platelets 315  Recent Lipid Panel No results found for: CHOL, TRIG, HDL, CHOLHDL, VLDL, LDLCALC, LDLDIRECT  Physical Exam:    VS:  BP 112/74 (BP Location: Left Arm, Patient Position: Sitting, Cuff Size:  Normal)   Pulse 78   Ht 5' (1.524 m)   Wt 122 lb (55.3 kg)   SpO2 98%   BMI 23.83 kg/m     Wt Readings from Last 3 Encounters:  04/21/24 122 lb (55.3 kg)  01/28/24 123 lb (55.8 kg)  01/07/24 120 lb (54.4 kg)     GEN:  Well nourished, well developed in no acute distress HEENT: Normal NECK: No JVD; No carotid bruits CARDIAC: RRR, no murmurs, rubs, gallops RESPIRATORY:  Clear to auscultation without rales, wheezing or rhonchi  ABDOMEN: Soft, non-tender, non-distended MUSCULOSKELETAL:  No edema; No deformity  SKIN: Warm and dry NEUROLOGIC:  Alert and oriented x 3 PSYCHIATRIC:  Normal affect   ASSESSMENT:    1. Shortness of breath   2. Palpitations   3. Chest pain of uncertain etiology   4. Primary hypertension     PLAN:    Palpitations: Zio patch x 7 days 08/2023 showed no significant arrhythmias.  She reports palpitations have improved  DOE: Check echocardiogram to evaluate for structural heart disease  Chest pain: Reports having chest pain that she describes as sharp pain in chest that occurs at rest.  Description suggests noncardiac chest pain, will hold off on ischemic evaluation at this time  Hypertension: Previously was on diltiazem 120 mg daily but not currently on any medications and appears normotensive.  Will monitor  RTC in 1 year   Medication Adjustments/Labs and Tests Ordered: Current medicines are reviewed at length with the patient today.  Concerns regarding medicines are outlined above.  Orders Placed This Encounter  Procedures   ECHOCARDIOGRAM COMPLETE   No orders of the defined types were placed in this encounter.   Patient Instructions  Medication Instructions:  Continue taking current medictions *If you need a refill on your cardiac medications before your next appointment, please call your pharmacy*  Lab Work: none If you have labs (blood work) drawn today and your tests are completely normal, you will receive your results only  by: MyChart Message (if you have MyChart) OR A paper copy in the mail If you have any lab test that is abnormal or we need to change your treatment, we will call you to review the results.  Testing/Procedures: Echo  Your physician has requested that you have an echocardiogram. Echocardiography is a painless test that uses sound waves to create images of your heart. It provides your doctor with information about the size and shape of your heart and how well your heart's chambers and valves are working. This procedure takes approximately one hour. There are no restrictions for this procedure. Please do NOT wear cologne, perfume, aftershave, or lotions (deodorant is allowed). Please arrive 15 minutes prior to your appointment time.  Please note: We ask at that you not bring children with you during  ultrasound (echo/ vascular) testing. Due to room size and safety concerns, children are not allowed in the ultrasound rooms during exams. Our front office staff cannot provide observation of children in our lobby area while testing is being conducted. An adult accompanying a patient to their appointment will only be allowed in the ultrasound room at the discretion of the ultrasound technician under special circumstances. We apologize for any inconvenience.   Follow-Up: At [facility], you and your health needs are our priority.  As part of our continuing mission to provide you with exceptional heart care, our providers are all part of one team.  This team includes your primary Cardiologist (physician) and Advanced Practice Providers or APPs (Physician Assistants and Nurse Practitioners) who all work together to provide you with the care you need, when you need it.  Your next appointment:   1 year(s)  Provider:   Lonni LITTIE Nanas, MD    We recommend signing up for the patient portal called MyChart.  Sign up information is provided on this After Visit Summary.  MyChart is used to  connect with patients for Virtual Visits (Telemedicine).  Patients are able to view lab/test results, encounter notes, upcoming appointments, etc.  Non-urgent messages can be sent to your provider as well.   To learn more about what you can do with MyChart, go to [email or web address].   Other Instructions none       Signed, Lonni LITTIE Nanas, MD  04/21/2024 9:39 AM    [facility]

## 2024-04-21 ENCOUNTER — Ambulatory Visit: Attending: Cardiology | Admitting: Cardiology

## 2024-04-21 VITALS — BP 112/74 | HR 78 | Ht 60.0 in | Wt 122.0 lb

## 2024-04-21 DIAGNOSIS — I10 Essential (primary) hypertension: Secondary | ICD-10-CM | POA: Insufficient documentation

## 2024-04-21 DIAGNOSIS — R002 Palpitations: Secondary | ICD-10-CM | POA: Insufficient documentation

## 2024-04-21 DIAGNOSIS — R079 Chest pain, unspecified: Secondary | ICD-10-CM | POA: Insufficient documentation

## 2024-04-21 DIAGNOSIS — R0602 Shortness of breath: Secondary | ICD-10-CM | POA: Diagnosis present

## 2024-04-21 NOTE — Patient Instructions (Signed)
 Medication Instructions:  Continue taking current medictions *If you need a refill on your cardiac medications before your next appointment, please call your pharmacy*  Lab Work: none If you have labs (blood work) drawn today and your tests are completely normal, you will receive your results only by: MyChart Message (if you have MyChart) OR A paper copy in the mail If you have any lab test that is abnormal or we need to change your treatment, we will call you to review the results.  Testing/Procedures: Echo  Your physician has requested that you have an echocardiogram. Echocardiography is a painless test that uses sound waves to create images of your heart. It provides your doctor with information about the size and shape of your heart and how well your heart's chambers and valves are working. This procedure takes approximately one hour. There are no restrictions for this procedure. Please do NOT wear cologne, perfume, aftershave, or lotions (deodorant is allowed). Please arrive 15 minutes prior to your appointment time.  Please note: We ask at that you not bring children with you during ultrasound (echo/ vascular) testing. Due to room size and safety concerns, children are not allowed in the ultrasound rooms during exams. Our front office staff cannot provide observation of children in our lobby area while testing is being conducted. An adult accompanying a patient to their appointment will only be allowed in the ultrasound room at the discretion of the ultrasound technician under special circumstances. We apologize for any inconvenience.   Follow-Up: At [facility], you and your health needs are our priority.  As part of our continuing mission to provide you with exceptional heart care, our providers are all part of one team.  This team includes your primary Cardiologist (physician) and Advanced Practice Providers or APPs (Physician Assistants and Nurse Practitioners) who all work  together to provide you with the care you need, when you need it.  Your next appointment:   1 year(s)  Provider:   Lonni LITTIE Nanas, MD    We recommend signing up for the patient portal called MyChart.  Sign up information is provided on this After Visit Summary.  MyChart is used to connect with patients for Virtual Visits (Telemedicine).  Patients are able to view lab/test results, encounter notes, upcoming appointments, etc.  Non-urgent messages can be sent to your provider as well.   To learn more about what you can do with MyChart, go to [email or web address].   Other Instructions none

## 2024-05-18 ENCOUNTER — Ambulatory Visit (HOSPITAL_COMMUNITY)
Admission: RE | Admit: 2024-05-18 | Discharge: 2024-05-18 | Disposition: A | Discharge status: Home / Self Care | Source: Ambulatory Visit | Attending: Cardiology | Admitting: Cardiology

## 2024-05-18 DIAGNOSIS — R0602 Shortness of breath: Secondary | ICD-10-CM | POA: Diagnosis not present

## 2024-05-18 LAB — ECHOCARDIOGRAM COMPLETE
Area-P 1/2: 5.62 cm2
S' Lateral: 2.5 cm

## 2024-05-19 ENCOUNTER — Ambulatory Visit: Payer: Self-pay | Admitting: Cardiology
